# Patient Record
Sex: Female | Born: 1980 | Race: White | Hispanic: No
Health system: Midwestern US, Community
[De-identification: ages and names within clinical notes are randomized; demographics above are authoritative.]

## PROBLEM LIST (undated history)

## (undated) DIAGNOSIS — O24419 Gestational diabetes mellitus in pregnancy, unspecified control: Secondary | ICD-10-CM

## (undated) DIAGNOSIS — F988 Other specified behavioral and emotional disorders with onset usually occurring in childhood and adolescence: Secondary | ICD-10-CM

## (undated) DIAGNOSIS — Z1401 Asymptomatic hemophilia A carrier: Secondary | ICD-10-CM

## (undated) DIAGNOSIS — R519 Headache, unspecified: Secondary | ICD-10-CM

## (undated) DIAGNOSIS — F329 Major depressive disorder, single episode, unspecified: Secondary | ICD-10-CM

## (undated) DIAGNOSIS — F32A Depression, unspecified: Secondary | ICD-10-CM

## (undated) DIAGNOSIS — O24410 Gestational diabetes mellitus in pregnancy, diet controlled: Secondary | ICD-10-CM

## (undated) DIAGNOSIS — I10 Essential (primary) hypertension: Secondary | ICD-10-CM

## (undated) DIAGNOSIS — T7840XA Allergy, unspecified, initial encounter: Secondary | ICD-10-CM

## (undated) DIAGNOSIS — F419 Anxiety disorder, unspecified: Secondary | ICD-10-CM

## (undated) DIAGNOSIS — Z309 Encounter for contraceptive management, unspecified: Secondary | ICD-10-CM

## (undated) DIAGNOSIS — Z832 Family history of diseases of the blood and blood-forming organs and certain disorders involving the immune mechanism: Secondary | ICD-10-CM

## (undated) HISTORY — DX: Other specified behavioral and emotional disorders with onset usually occurring in childhood and adolescence: F98.8

## (undated) HISTORY — DX: Depression, unspecified: F32.A

## (undated) HISTORY — DX: Anxiety disorder, unspecified: F41.9

## (undated) HISTORY — PX: MOLE REMOVAL: SHX2046

## (undated) HISTORY — DX: Gestational diabetes mellitus in pregnancy, unspecified control: O24.419

## (undated) HISTORY — DX: Asymptomatic hemophilia A carrier: Z14.01

## (undated) HISTORY — DX: Essential (primary) hypertension: I10

## (undated) HISTORY — DX: Allergy, unspecified, initial encounter: T78.40XA

## (undated) HISTORY — DX: Headache, unspecified: R51.9

## (undated) HISTORY — PX: WISDOM TOOTH EXTRACTION: SHX21

---
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
## 2013-02-13 NOTE — Progress Notes (Signed)
HISTORY OF PRESENT ILLNESS  Adriana Wilkins is a 32 y.o. female.  Headache  The history is provided by the patient. This is a chronic problem. The current episode started more than 1 week ago. The problem occurs daily. Associated symptoms include headaches. Pertinent negatives include no chest pain, no abdominal pain and no shortness of breath.     This is patient's first visit to our clinic, relocated from [place], [place] awhile back. Been having   chronic daily heaaches since her 20's - frontal, temples - is dull. Has allergies - works outside all day.    Tried chiropractors, meds, accupuncture for headaches. Has had bloodwork and 8 years ago an MRI. Prevention meds - antiseizure, mood stabilizer, antidepressant - all made her feel weird.  No b-blocker. Went to neurologist - topamax - felt crazy. Takes magnesium which helps some.   Feels it does affect her ability to do her job, etc.    Now having ear rining regularly in both, feels more foggy. Started new job in January. 2 months ago it started and stopped meds too. (she stopped taking fioricet and ultram b/c didn't want to take pain meds).  With her allergies she has congestion, pressure. No eyes or sneezing. Takes claritin-D occasionally - makes her sleepy. It does clear up her nose, can still have the headache even when on it. Has tried flonase before - had nosebleeds, nasacort helped but did same thing.     Has lesion on Right side of nose - lesion for a few years, sometime itches, getting bigger. Normal moles removed before.     Wants to start birth control. Has done ortho tricyclen in her 20's  Which worked well. She had an abortion 6 mos ago. LMP 3 weeks ago normal       Review of Systems   Constitutional: Negative for fever, chills and malaise/fatigue.   HENT: Positive for congestion. Negative for ear pain and sore throat.    Eyes: Negative for blurred vision, pain and redness.   Respiratory: Negative for cough and shortness of breath.    Cardiovascular:  Negative for chest pain and palpitations.   Gastrointestinal: Negative for nausea, vomiting and abdominal pain.   Genitourinary: Negative for dysuria.   Musculoskeletal: Negative for myalgias and back pain.   Skin: Negative for rash.   Neurological: Positive for headaches. Negative for dizziness.   Psychiatric/Behavioral: Negative for depression.     BP 120/70   Pulse 75   Temp(Src) 98.5 ??F (36.9 ??C) (Oral)   Ht 5' 1.5" (1.562 m)   Wt 134 lb (60.782 kg)   BMI 24.91 kg/m2   SpO2 98%     Physical Exam   Nursing note and vitals reviewed.  Constitutional: She is oriented to person, place, and time. She appears well-developed and well-nourished. No distress.   HENT:   Head: Normocephalic and atraumatic.       Right Ear: Tympanic membrane, external ear and ear canal normal.   Left Ear: Tympanic membrane, external ear and ear canal normal.   Nose: Nose normal.   Mouth/Throat: Oropharynx is clear and moist. No posterior oropharyngeal erythema.   Eyes: Conjunctivae, EOM and lids are normal. Pupils are equal, round, and reactive to light.   Neck: No JVD present. No thyromegaly present.   Cardiovascular: Normal rate, regular rhythm, normal heart sounds and intact distal pulses.  Exam reveals no gallop and no friction rub.    No murmur heard.  Pulmonary/Chest: Effort normal and breath sounds normal.  She has no wheezes.   Lymphadenopathy:     She has no cervical adenopathy.   Neurological: She is alert and oriented to person, place, and time.   Skin: Skin is warm and dry.       ASSESSMENT and PLAN    ICD-9-CM    1. Chronic daily headache 784.0 Diclofenac Potassium (CAMBIA) 50 mg pwpk     propranolol LA (INDERAL LA) 60 mg SR capsule    has had a lot of workup and tried meds, will do trial of b-blocker, RTC 1 mos. If not improving consider neuro referral, she may want to try botox injections   2. Allergic rhinitis 477.9 mometasone (NASONEX) 50 mcg/actuation nasal spray    not well controlled, take claritin daily plus trial of  nasonex spray which may be less irritating. RTC if not improving   3. Contraception management V25.9 norgestimate-ethinyl estradiol (ORTHO TRI-CYCLEN, 28,) 0.18/0.215/0.25 mg-35 mcg (28) tablet    wants to restart OCP - did well on Ortho Tricyclen before. PAP done 2 years ago, RTC anytime for CPE/PAP   4. Skin lesion 709.9 REFERRAL TO DERMATOLOGY    looks benign on right side of nose but she would like to get full skin check anyway so will have derm evaluate it as well.   45 minutes spent counseling patient today.

## 2013-02-18 NOTE — Telephone Encounter (Signed)
[employer] will not approve the script how it is written, 1 pack equals 9 packs of powder for the Cambia.Marland KitchenMarland Kitchen However insurance will only approve  4 individual pouches of powder so this may need to be rewritten in order for the patient to get the medication

## 2013-05-08 LAB — AMB POC URINALYSIS DIP STICK MANUAL W/ MICRO
Bilirubin (UA POC): NEGATIVE
Glucose (UA POC): NEGATIVE
Ketones (UA POC): NEGATIVE
Leukocyte esterase (UA POC): NEGATIVE
Nitrites (UA POC): NEGATIVE
Specific gravity (UA POC): 1.01 (ref 1.001–1.035)
Urobilinogen (UA POC): 0.2 (ref 0.2–1)
pH (UA POC): 6 (ref 4.6–8.0)

## 2013-05-08 LAB — AMB POC COMPLETE CBC,AUTOMATED ENTER
ABS. GRANS (POC): 2.2 10*3/uL (ref 1.4–6.5)
ABS. LYMPHS (POC): 1.8 10*3/uL (ref 1.2–3.4)
ABS. MONOS (POC): 0.3 10*3/uL (ref 0.1–0.6)
GRANULOCYTES (POC): 50.8 % (ref 42.2–75.2)
HCT (POC): 44.6 % (ref 35–60)
HGB (POC): 14.6 g/dL (ref 11–18)
LYMPHOCYTES (POC): 42.7 % (ref 20.5–51.1)
MCH (POC): 30.1 pg (ref 27–31)
MCHC (POC): 32.8 g/dL — AB (ref 33–37)
MCV (POC): 91.8 fL (ref 80–99.9)
MONOCYTES (POC): 6.5 % (ref 1.7–9.3)
MPV (POC): 9.4 fL (ref 7.8–11)
PLATELET (POC): 180 10*3/uL (ref 150–450)
RBC (POC): 4.86 M/uL (ref 4–6)
RDW (POC): 13.5 % (ref 11.6–13.7)
WBC (POC): 4.3 10*3/uL — AB (ref 4.5–10.5)

## 2013-05-08 NOTE — Progress Notes (Signed)
HISTORY OF PRESENT ILLNESS  Adriana Wilkins is a 32 y.o. female.  HPI  The patient is a 32 y.o. female who is seen for a comprehensive physical exam.The patient reports no problems. Health behaviors: generally follows a low fat low cholesterol diet.    The patient is sexually active.  Current gynecologic symptoms include: none.   Otherwise the patient is doing well.       LMP:  Patient's last menstrual period was 04/19/2013.    Period Frequency: Every 28 days    Duration: 7 days    Description of Vaginal Bleeding:  moderate lochia    Current contraceptive method: oral contraceptives (estrogen/progesterone)    After her last appt she remember she had tried inderal before for her headaches and it made her tired so she didn't start it. Has been feeling better -  Exercising, OCP,  daily claritin all seem to be helping the headaches. Takes excedrin migraine when they do occur.  The OCP is doing well - spotting at first but now resolved.     Abnormal pap 5-6 years ago, had to just do a repeat one and then fine after   bloodwork done 2-3 years ago  No FH of breast cancer    Review of Systems   Constitutional: Negative for fever, chills, weight loss and malaise/fatigue.   HENT: Negative for ear pain, congestion and sore throat.    Eyes: Negative for blurred vision and pain.   Respiratory: Negative for cough, shortness of breath and wheezing.    Cardiovascular: Negative for chest pain, palpitations and leg swelling.   Gastrointestinal: Negative for heartburn, nausea, vomiting, abdominal pain, diarrhea, constipation, blood in stool and melena.   Genitourinary: Negative for dysuria, urgency and frequency.   Musculoskeletal: Negative for myalgias and joint pain.   Skin: Negative for rash.   Neurological: Negative for dizziness, sensory change, focal weakness, weakness and headaches.   Endo/Heme/Allergies: Does not bruise/bleed easily.   Psychiatric/Behavioral: Negative for depression. The patient is not nervous/anxious.    All  other systems reviewed and are negative.      BP 110/70   Pulse 94   Temp(Src) 99.2 ??F (37.3 ??C) (Oral)   Ht 5' 1.5" (1.562 m)   Wt 131 lb (59.421 kg)   BMI 24.35 kg/m2   SpO2 99%   LMP 04/19/2013   Breastfeeding? No     Physical Exam   Nursing note and vitals reviewed.  Constitutional: She is oriented to person, place, and time. She appears well-developed and well-nourished. No distress.   HENT:   Head: Normocephalic and atraumatic.   Right Ear: External ear normal.   Left Ear: External ear normal.   Nose: Nose normal.   Mouth/Throat: Oropharynx is clear and moist.   Eyes: Conjunctivae and EOM are normal. Pupils are equal, round, and reactive to light.   Neck: Normal range of motion. Neck supple. No thyromegaly present.   Cardiovascular: Normal rate, regular rhythm, normal heart sounds and intact distal pulses.  Exam reveals no gallop and no friction rub.    No murmur heard.  Pulmonary/Chest: Effort normal and breath sounds normal. She has no wheezes. Right breast exhibits no mass and no nipple discharge. Left breast exhibits no mass and no nipple discharge.   Abdominal: Soft. Bowel sounds are normal. There is no tenderness.   Genitourinary: Vagina normal and uterus normal. No breast tenderness or discharge. There is no rash or lesion on the right labia. There is no rash or lesion on  the left labia. Cervix exhibits no motion tenderness, no discharge and no friability. Right adnexum displays no mass, no tenderness and no fullness. Left adnexum displays no mass, no tenderness and no fullness. No tenderness around the vagina. No vaginal discharge found.   Musculoskeletal: Normal range of motion. She exhibits no edema and no tenderness.   Lymphadenopathy:     She has no cervical adenopathy.     She has no axillary adenopathy.        Right: No inguinal and no supraclavicular adenopathy present.        Left: No inguinal and no supraclavicular adenopathy present.   Neurological: She is alert and oriented to person, place,  and time.   Skin: Skin is warm and dry. No rash noted.   Psychiatric: She has a normal mood and affect. Her behavior is normal. Judgment and thought content normal.       ASSESSMENT and PLAN    ICD-9-CM    1. Routine gynecological examination V72.31 AMB POC COMPLETE CBC,AUTOMATED ENTER     AMB POC URINALYSIS DIP STICK MANUAL W/ MICRO     METABOLIC PANEL, COMPREHENSIVE     LIPID PANEL     PAP (IMAGE GUIDED) + HPV HIGH RISK    PAP and breast exam done today, if PAP normal repeat in 2-3 years. routine labs today.

## 2013-05-09 LAB — METABOLIC PANEL, COMPREHENSIVE
A-G Ratio: 1.9 (ref 1.1–2.5)
ALT (SGPT): 10 IU/L (ref 0–32)
AST (SGOT): 13 IU/L (ref 0–40)
Albumin: 4.3 g/dL (ref 3.5–5.5)
Alk. phosphatase: 38 IU/L — ABNORMAL LOW (ref 39–117)
BUN/Creatinine ratio: 10 (ref 8–20)
BUN: 9 mg/dL (ref 6–20)
Bilirubin, total: 0.4 mg/dL (ref 0.0–1.2)
CO2: 21 mmol/L (ref 18–29)
Calcium: 9.4 mg/dL (ref 8.7–10.2)
Chloride: 102 mmol/L (ref 97–108)
Creatinine: 0.86 mg/dL (ref 0.57–1.00)
GFR est AA: 103 mL/min/{1.73_m2} (ref >59)
GFR est non-AA: 90 mL/min/{1.73_m2} (ref >59)
GLOBULIN, TOTAL: 2.3 g/dL (ref 1.5–4.5)
Glucose: 82 mg/dL (ref 65–99)
Potassium: 4 mmol/L (ref 3.5–5.2)
Protein, total: 6.6 g/dL (ref 6.0–8.5)
Sodium: 139 mmol/L (ref 134–144)

## 2013-05-09 LAB — LIPID PANEL
Cholesterol, total: 205 mg/dL — ABNORMAL HIGH (ref 100–199)
HDL Cholesterol: 84 mg/dL (ref >39)
LDL, calculated: 104 mg/dL — ABNORMAL HIGH (ref 0–99)
Triglyceride: 85 mg/dL (ref 0–149)
VLDL, calculated: 17 mg/dL (ref 5–40)

## 2013-05-09 NOTE — Telephone Encounter (Signed)
Message copied by Deatra Ina on Thu May 09, 2013 12:15 PM  ------       Message from: Charlsie Merles CASTON       Created: Thu May 09, 2013 10:24 AM         Her labs look good - all normal. Her lipids are fine for her age range - chol 205, LDL/bad 104. Good chol/HDL high at 84 which is a good thing - this can make the total chol # a little higher but is OK b/c it protects against plaque formation in the blood vessels.  ------

## 2013-05-09 NOTE — Telephone Encounter (Signed)
Left message for patient to return our call.

## 2013-05-09 NOTE — Telephone Encounter (Signed)
Patient notified of information.

## 2013-05-10 LAB — PAP (IMAGE GUIDED) + HPV HIGH RISK
.: 0
HPV DNA Probe, High Risk: NEGATIVE
HPV, High Risk: NEGATIVE
LABCORP 019018: 0

## 2013-07-10 ENCOUNTER — Encounter

## 2013-07-10 NOTE — Telephone Encounter (Signed)
Referral ordered. Stay on birth control pills until she sees them and they give her further instructions

## 2013-07-10 NOTE — Telephone Encounter (Signed)
Patient would 1) like a referral to a gyn to have an IUD placed. 2) does she need to stay on the pills until the appointment?

## 2013-07-10 NOTE — Telephone Encounter (Signed)
Patient notified of information.

## 2013-07-17 NOTE — Progress Notes (Signed)
HISTORY OF PRESENT ILLNESS  Adriana Wilkins is a 32 y.o. female.  HPI  Headache  The history is provided by the patient. This is a chronic problem. The current episode started more than 1 week ago. The problem occurs daily. Associated symptoms include headaches. Pertinent negatives include no chest pain, no abdominal pain and no shortness of breath.     This is patient's first visit to our clinic, relocated from [place], [place] awhile back. Been having   chronic daily heaaches since her 20's - frontal, temples - is dull. Has allergies - works outside all day.    Tried chiropractors, meds, accupuncture for headaches. Has had bloodwork and 8 years ago an MRI. Prevention meds - antiseizure, mood stabilizer, antidepressant - all made her feel weird.  No b-blocker. Went to neurologist - topamax - felt crazy. Takes magnesium which helps some.   Feels it does affect her ability to do her job, etc.    Now having ear rining regularly in both, feels more foggy. Started new job in January. 2 months ago it started and stopped meds too. (she stopped taking fioricet and ultram b/c didn't want to take pain meds).  With her allergies she has congestion, pressure. No eyes or sneezing. Takes claritin-D occasionally - makes her sleepy. It does clear up her nose, can still have the headache even when on it. Has tried flonase before - had nosebleeds, nasacort helped but did same thing.     Has lesion on Right side of nose - lesion for a few years, sometime itches, getting bigger. Normal moles removed before.     Wants to start birth control. Has done ortho tricyclen in her 20's  Which worked well. She had an abortion 6 mos ago. LMP 3 weeks ago normal     UPDATE 07/17/13: having Side effect from OCP - moody, weight gain  Wants to try nuva ring instead to see if less side effects.    Headaches are back - taking advil and excedrin.   She wants to see if they improve once off of OCP.        Review of Systems   Constitutional: Negative for fever,  chills and malaise/fatigue.   HENT: Negative for ear pain, congestion and sore throat.    Eyes: Negative for blurred vision, pain and redness.   Respiratory: Negative for cough and shortness of breath.    Cardiovascular: Negative for chest pain and palpitations.   Gastrointestinal: Negative for nausea, vomiting and abdominal pain.   Genitourinary: Negative for dysuria.   Musculoskeletal: Negative for myalgias and back pain.   Skin: Negative for rash.   Neurological: Positive for headaches. Negative for dizziness.   Psychiatric/Behavioral: Negative for depression.     BP 130/78   Pulse 88   Temp(Src) 98.4 ??F (36.9 ??C) (Oral)   Ht 5' 1.5" (1.562 m)   Wt 137 lb (62.143 kg)   BMI 25.47 kg/m2   SpO2 98%     Physical Exam   Constitutional: She is oriented to person, place, and time. She appears well-developed and well-nourished. She is cooperative. No distress.   HENT:   Head: Normocephalic and atraumatic.   Pulmonary/Chest: Effort normal. No respiratory distress.   Neurological: She is alert and oriented to person, place, and time. Coordination and gait normal.   Skin: No rash noted. She is not diaphoretic. No pallor.   Psychiatric: She has a normal mood and affect. Her speech is normal and behavior is normal. Judgment and thought  content normal. Her mood appears not anxious. Cognition and memory are normal. She does not exhibit a depressed mood.       ASSESSMENT and PLAN    ICD-9-CM   1. Contraception management V25.9    wants to switch from OCP to Nuvaring - new rx sent. call back if any side effects or problems.   2. Chronic daily headache 784.0    if doesn't improve after changing birth control, told her to call [place] for neurology referral.

## 2013-12-20 LAB — AMB POC COMPLETE CBC,AUTOMATED ENTER
ABS. GRANS (POC): 2.7 10*3/uL (ref 1.4–6.5)
ABS. LYMPHS (POC): 2.1 10*3/uL (ref 1.2–3.4)
ABS. MONOS (POC): 0.4 10*3/uL (ref 0.1–0.6)
GRANULOCYTES (POC): 51.9 % (ref 42.2–75.2)
HCT (POC): 44.3 % (ref 35–60)
HGB (POC): 13.9 g/dL (ref 11–18)
LYMPHOCYTES (POC): 39.7 % (ref 20.5–51.1)
MCH (POC): 28.1 pg (ref 27–31)
MCHC (POC): 31.4 g/dL — AB (ref 33–37)
MCV (POC): 89.4 fL (ref 80–99.9)
MONOCYTES (POC): 8.4 % (ref 1.7–9.3)
MPV (POC): 7.7 fL — AB (ref 7.8–11)
PLATELET (POC): 208 10*3/uL (ref 150–450)
RBC (POC): 4.95 M/uL (ref 4–6)
RDW (POC): 12.7 % (ref 11.6–13.7)
WBC (POC): 5.2 10*3/uL (ref 4.5–10.5)

## 2013-12-20 NOTE — Progress Notes (Signed)
HISTORY OF PRESENT ILLNESS  Adriana Wilkins is a 33 y.o. female. Has suffered from chronic daily headaches since her 4420's. Is haivn them more frequently  Has weight gain has fatigue is always tired no matter how much slep she gts has brain fog. 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 Has pain in her hands and feet. Has fh of Dm no thyroid disease. Her hands get swollen and they hurt an throbbing ache mostly in her fingers. No redness. Has been having heavy periods Does not eat much meat.   Recently went back to [place] was started on med for an ADD by an MD in [place] No sure if what she needs or not.   HPI    Review of Systems   Constitutional: Weight loss: weight gain.   Musculoskeletal: Positive for joint pain (in fingers of both hands).   Psychiatric/Behavioral:        Hypersomnia       Physical Exam   Constitutional: She is oriented to person, place, and time. She appears well-developed and well-nourished.   HENT:   Head: Normocephalic.   Eyes: Pupils are equal, round, and reactive to light.   Neck: Normal range of motion. Neck supple. No thyromegaly present.   Cardiovascular: Normal rate, regular rhythm and normal heart sounds.    Pulmonary/Chest: Effort normal and breath sounds normal.   Abdominal: Soft. Bowel sounds are normal.   Musculoskeletal: Normal range of motion.   Lymphadenopathy:     She has no cervical adenopathy.   Neurological: She is alert and oriented to person, place, and time.   Skin: Skin is warm and dry.   Psychiatric: She has a normal mood and affect.   Nursing note and vitals reviewed.    BP 132/80    Pulse 110    Temp(Src) 98.6 ??F (37 ??C) (Oral)    Ht 5' 1.5" (1.562 m)    Wt 143 lb (64.864 kg)    BMI 26.59 kg/m2      LMP 11/25/2013     ASSESSMENT and PLAN    ICD-9-CM    1. Fatigue 780.79 TSH, 3RD GENERATION     AMB POC COMPLETE CBC,AUTOMATED ENTER   Checking labs. May be untreated depression as she has a history of this. May also consider a sleep study if symptoms  persists and labs are normal. Will have her follow up with Dr Alfredo Bachecil in 2 weeks as she needs ADD meds

## 2013-12-21 LAB — TSH 3RD GENERATION: TSH: 1.84 u[IU]/mL (ref 0.450–4.500)

## 2013-12-25 MED ORDER — BUPROPION XL 150 MG 24 HR TAB
150 mg | ORAL_TABLET | ORAL | Status: DC
Start: 2013-12-25 — End: 2014-01-20

## 2013-12-25 NOTE — Progress Notes (Signed)
HISTORY OF PRESENT ILLNESS  Adriana Wilkins is a 33 y.o. female.  HPI   The patient is a 33 y.o. female who is seen for complaints of depression. she  complains of depressed mood, anhedonia, weight gain and hypersomnia. Onset was approximately 1 year ago, gradually worsening since that time.  He denies current suicidal and homicidal plan or intent.   Family history significant for depression.Possible organic causes contributing are: none.  Risk factors: none apparent Previous treatment includes none.   Feels tired all the time, had thyroid checked but was normal. Not anemic either. Snores sometimes but not much, doesn't think it is sleep apnea.   After her last appt been thinking about it more and she thinks it could be depression - in the last year overall felt a decline, worse in past few months (even before winter). Was blah/bummed out at her best friends wedding in September. Mom has depression and her MGM. Has had little bouts of it but hasn't had to take meds for it before. Is messing up at work.   Today is her day off, slept till 11am instead of getting up and doing things she had planned. Can sleep more than usual, but still feels tired. Skipped a conference recently. Crying more. Appetite normal, but is gaining weight. No panic attacks or anxiety.   Saw her old doctor in [place] - he gave her adderall and xanax b/c she wasn't getting things done. It helps temporarily but she feels it is masking the real issue. 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  Xanax - was taking it 1/2 in afternoon as coming off of adderall b/c it makes her irritable.    Review of Systems   Constitutional: Positive for malaise/fatigue. Negative for fever and chills.   HENT: Negative for congestion, ear pain and sore throat.    Eyes: Negative for blurred vision, pain and redness.   Respiratory: Negative for cough and shortness of breath.    Cardiovascular: Negative for chest pain and palpitations.   Gastrointestinal:  Negative for nausea, vomiting and abdominal pain.   Genitourinary: Negative for dysuria.   Musculoskeletal: Negative for myalgias and back pain.   Skin: Negative for rash.   Neurological: Negative for dizziness and headaches.   Psychiatric/Behavioral: Positive for depression.     BP 136/78    Pulse 107    Temp(Src) 98.5 ??F (36.9 ??C) (Oral)    Ht 5' 1.5" (1.562 m)    Wt 143 lb (64.864 kg)    BMI 26.59 kg/m2      SpO2 99%    LMP 11/25/2013        Physical Exam   Constitutional: She is oriented to person, place, and time. She appears well-developed and well-nourished. She is cooperative. No distress.   HENT:   Head: Normocephalic and atraumatic.   Pulmonary/Chest: Effort normal. No respiratory distress.   Neurological: She is alert and oriented to person, place, and time. Coordination and gait normal.   Skin: No rash noted. She is not diaphoretic. No pallor.   Psychiatric: Her speech is normal and behavior is normal. Judgment and thought content normal. Her mood appears not anxious. Cognition and memory are normal. She exhibits a depressed mood.   Tearful at times       ASSESSMENT and PLAN    ICD-9-CM    1. Depression 311 buPROPion XL (WELLBUTRIN XL) 150 mg tablet    having episode of depression, start wellbutrin XL 150mg   and RTC 2-3 wks for close follow-up, call sooner if problems. went over possible SE's.   25 minutes spent counseling patient today.   rec she hold off on taking the xanax or adderall right now as we try to work on her depression which seems to be the main underlying issue.

## 2014-01-08 NOTE — Progress Notes (Signed)
HISTORY OF PRESENT ILLNESS  Adriana Wilkins is a 33 y.o. female.  HPI    The patient is a 33 y.o. female who is seen for complaints of depression. she  complains of depressed mood, anhedonia, weight gain and hypersomnia. Onset was approximately 1 year ago, gradually worsening since that time.  He denies current suicidal and homicidal plan or intent.   Family history significant for depression.Possible organic causes contributing are: none.  Risk factors: none apparent Previous treatment includes none.   Feels tired all the time, had thyroid checked but was normal. Not anemic either. Snores sometimes but not much, doesn't think it is sleep apnea.   After her last appt been thinking about it more and she thinks it could be depression - in the last year overall felt a decline, worse in past few months (even before winter). Was blah/bummed out at her best friends wedding in September. Mom has depression and her MGM. Has had little bouts of it but hasn't had to take meds for it before. Is messing up at work.   Today is her day off, slept till 11am instead of getting up and doing things she had planned. Can sleep more than usual, but still feels tired. Skipped a conference recently. Crying more. Appetite normal, but is gaining weight. No panic attacks or anxiety.   Saw her old doctor in [place] - he gave her adderall and xanax b/c she wasn't getting things done. It helps temporarily but she feels it is masking the real issue. 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  Xanax - was taking it 1/2 in afternoon as coming off of adderall b/c it makes her irritable.    UPDATE 01/08/14: slight improvement since starting wellbutrin, still having hard time getting stuff done. Feels she is about 15% better. This week restarted the adderall b/c was more brain fogginess but says she would prefer to not have to take it.  Still has some wellbutrin XL 150mg  so she can try increasing dose to see if it works better or not.     Review of Systems   Constitutional: Positive for malaise/fatigue. Negative for fever and chills.   HENT: Negative for congestion, ear pain and sore throat.    Eyes: Negative for blurred vision, pain and redness.   Respiratory: Negative for cough and shortness of breath.    Cardiovascular: Negative for chest pain and palpitations.   Gastrointestinal: Negative for nausea, vomiting and abdominal pain.   Genitourinary: Negative for dysuria.   Musculoskeletal: Negative for myalgias and back pain.   Skin: Negative for rash.   Neurological: Negative for dizziness and headaches.   Psychiatric/Behavioral: Positive for depression.     BP 126/78    Pulse 86    Temp(Src) 98.1 ??F (36.7 ??C) (Oral)    Ht 5' 1.5" (1.562 m)    Wt 142 lb (64.411 kg)    BMI 26.40 kg/m2      SpO2 99%    LMP 11/25/2013        Physical Exam   Constitutional: She is oriented to person, place, and time. She appears well-developed and well-nourished. She is cooperative. No distress.   HENT:   Head: Normocephalic and atraumatic.   Pulmonary/Chest: Effort normal. No respiratory distress.   Neurological: She is alert and oriented to person, place, and time. Coordination and gait normal.   Skin: No rash noted. She is not diaphoretic. No pallor.   Psychiatric: Her speech is normal  and behavior is normal. Judgment and thought content normal. Her mood appears not anxious. Cognition and memory are normal. She exhibits a depressed mood.       ROS    Physical Exam    ASSESSMENT and PLAN    ICD-9-CM   1. Depression 311    slight improvement, incr wellbutrin XL to 300mg  qam. call in 1-2wks to let us know if that dose is working and will call in. RTC 1 mos to recheck.   15 minutes spent counseling patient today.

## 2014-01-20 MED ORDER — BUPROPION XL 300 MG 24 HR TAB
300 mg | ORAL_TABLET | ORAL | Status: DC
Start: 2014-01-20 — End: 2014-07-24

## 2014-01-20 NOTE — Telephone Encounter (Signed)
Doing well on Wellbutrin xl 300mg . Refill request to the [facility] in [place].

## 2014-04-16 NOTE — Telephone Encounter (Signed)
Request for refill on xanax 0.5mg  takes prn to [facility] in [place].

## 2014-04-17 MED ORDER — ALPRAZOLAM 0.5 MG TAB
0.5 mg | ORAL_TABLET | Freq: Two times a day (BID) | ORAL | Status: DC | PRN
Start: 2014-04-17 — End: 2014-07-02

## 2014-04-17 NOTE — Telephone Encounter (Signed)
Patient notified of information. rx called in.

## 2014-04-17 NOTE — Telephone Encounter (Signed)
Small supply to call in

## 2014-06-09 NOTE — Telephone Encounter (Signed)
Patient has called in today asking for a referral to [facility] for testing,  She has not been seen in office since March,  How would you like to proceed?  Chyrl Civatte

## 2014-06-10 ENCOUNTER — Encounter

## 2014-06-10 NOTE — Telephone Encounter (Signed)
Referral placed

## 2014-06-10 NOTE — Telephone Encounter (Signed)
We would need to know what the referral is for and then can decide if appt needed? No notes right now to support referral

## 2014-07-02 NOTE — Progress Notes (Signed)
07/02/2014    Adriana Wilkins  May 03, 1981  Age: 33 y.o.    Patient's last menstrual period was 06/24/2014 (exact date).    G2 P0020  PCP: Cherie Ouch Alfredo Bach, MD  Patient does see them for regular preventative visits.      HPI: AE new pt. Wants to conceive soon. 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 Has had 2 el ab's. Has been having yrly exams/paps with pcp in the past. Knows she needs to come off adderall with pregnancy. On wellbutrin for depression. It also helps her with HAs, so they are probably stress related. 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 Her mom is a carrier. Has an appt soon with [facility]. Husband with no FH genetic d/o's or birth defects. Has had recent nl tsh, lipids. No concerns about hiv risks. Gets occ vag opening irritation and swelling. None today. Goes away in a few days spontaneously.     Menstrual History  Menarche: 11  Period cycle: 28 - 30  Period duration in days: 4  Period pattern: Regular  Menstrual flow: Moderate  Period control: Tampon regular  Frequency of change for flow control: 3 times a day  Dysmenorrhea: (!) Mild  Dysmenorrhea symptoms: Cramping  Dyspareunia: None  Post-coital bleeding: None   GYN HISTORY 07/02/2014   Current form of contraception None   Menarche 11   Period cycle 28 - 30   Period duration in days 4   Period pattern Regular   Menstrual flow Moderate   Period control Tampon regular   Frequency of change for flow control 3 x d   Dysmenorrhea Mild   Dysmenorrhea symptoms Cramping   Dyspareunia None   Post-coital bleeding None   Pap Date 05/08/2013   Pap Results wnl       Allergies, medications, past medical and surgical history, social history, family history all reviewed.       Review of Systems  +HAs, heavy menses, cramps, change in moles(on L hand, sees derm regularly, plans to have them ck it)  Constitutional:  Denies unexplained weight loss or heat or cold intolerance  ENT: Denies change in vision, change in hearing  Cardiovascular:  Denies chest pain, swelling in legs or feet, shortness of  breath when lying flat  Respiratory:  Denies shortness of breath, cough greater than 2 weeks or coughing up blood  Gastro: Denies diarrhea greater than 2 weeks, rectal bleeding, bloody stools, heartburn, or constipation  GU:  Denies blood in urine, nocturia, dysuria or incontinence  Breast:  Denies nipple discharge, masses or pain  Skin:  Denies rash greater than 2 weeks, change in moles  Musculoskeletal/Neuro:  Denies joint pain, muscle weakness, seizures, loss of balance or frequent falls  Psych:  Denies frequent crying spells or severe anxiety  Heme:  Denies easy bruising, bleeding gums, frequent nosebleeds or swollen lymph nodes  GYN:  Denies bleeding or spotting between menses,  menses longer than 7 days, pain with sex    BP 130/72 mmHg   Ht 5' 1.5" (1.562 m)   Wt 129 lb (58.514 kg)   BMI 23.98 kg/m2   LMP 06/24/2014 (Exact Date)    Body mass index is 23.98 kg/(m^2).    Pt in no distress. Alert and oriented x3. Affect bright.  Well developed, well nourished.  HEENT: normocephalic, atraumatic. Sclerae nonicteric.  Neck supple w/o thyromegaly. Trachea midline.  Heart: regular rate and rhythm, no murmur or gallop  Lungs: clear to auscultation. Respiratory effort normal.  Breasts: no masses or axillary lymphadenopathy  Abdomen:  soft and nontender, no masses, no organomegaly, no ventral hernia  Pelvic: normal external genitalia, urethral meatus, urethra, vagina and cervix. Bimanual exam w/o masses or tenderness. Bladder nontender. Uterus normal size. Adnexae normal.  Perineum and anus normal. No hemorrhoids.       Encounter Diagnosis   Name Primary?   ??? Routine gynecological examination Yes         Orders Placed This Encounter   ??? PAP, LB, RFX HPV RUEAV([id number])          Pt counseled about CF screen. Can be cked for rub immunity but pt is only using natural family planning and she'd have to use good BC to get the vaccine. Rx pnv.     Luther Hearing, MD

## 2014-07-04 LAB — PAP, LB, RFX HPV ASCUS(507301)
.: 0
LABCORP 019018: 0

## 2014-07-24 ENCOUNTER — Ambulatory Visit
Admit: 2014-07-24 | Discharge: 2014-07-24 | Payer: BLUE CROSS/BLUE SHIELD | Attending: Family Medicine | Primary: Family Medicine

## 2014-07-24 DIAGNOSIS — F32A Depression, unspecified: Secondary | ICD-10-CM

## 2014-07-24 MED ORDER — LISDEXAMFETAMINE 30 MG CAP
30 mg | ORAL_CAPSULE | ORAL | Status: DC
Start: 2014-07-24 — End: 2014-08-25

## 2014-07-24 MED ORDER — BUPROPION XL 300 MG 24 HR TAB
300 mg | ORAL_TABLET | ORAL | Status: DC
Start: 2014-07-24 — End: 2015-01-14

## 2014-07-24 NOTE — Progress Notes (Signed)
HISTORY OF PRESENT ILLNESS  Adriana Wilkins is a 33 y.o. female.  HPI   The patient is a 33 y.o. female who is seen for follow up of depression, ADD.  Symptoms are stable.  The patient is following treatment regimen with good compliance. wellbutrin doing well, might be thinking ahead to wanting to become pregnant so asked about meds and what she needs to do.     ADD - adderall does make her irritable so wants to try something different, heard good things about vyvanse.   Review of Systems   Constitutional: Negative for fever, chills and malaise/fatigue.   HENT: Negative for congestion, ear pain and sore throat.    Eyes: Negative for blurred vision, pain and redness.   Respiratory: Negative for cough and shortness of breath.    Cardiovascular: Negative for chest pain and palpitations.   Gastrointestinal: Negative for nausea, vomiting and abdominal pain.   Genitourinary: Negative for dysuria.   Musculoskeletal: Negative for myalgias and back pain.   Skin: Negative for rash.   Neurological: Negative for dizziness and headaches.   Psychiatric/Behavioral: Negative for depression.     BP 122/82 mmHg   Pulse 105   Temp(Src) 98.3 ??F (36.8 ??C) (Oral)   Ht 5' 1.5" (1.562 m)   Wt 130 lb (58.968 kg)   BMI 24.17 kg/m2   LMP 06/24/2014 (Exact Date)     Physical Exam   Constitutional: She is oriented to person, place, and time. She appears well-developed and well-nourished. She is cooperative. No distress.   HENT:   Head: Normocephalic and atraumatic.   Pulmonary/Chest: Effort normal. No respiratory distress.   Neurological: She is alert and oriented to person, place, and time. Coordination and gait normal.   Skin: No rash noted. She is not diaphoretic. No pallor.   Psychiatric: She has a normal mood and affect. Her speech is normal and behavior is normal. Judgment and thought content normal. Her mood appears not anxious. Cognition and memory are normal. She does not exhibit a depressed mood.       ASSESSMENT and PLAN     ICD-10-CM ICD-9-CM    1. Depression F32.9 311 buPROPion XL (WELLBUTRIN XL) 300 mg XL tablet    cont wellbutrin XL 300mg , RTC 6 mos. discsused that she would need to stop or switch to different antidepressant once starts trying to conceive   2. ADD (attention deficit disorder) F90.9 314.00 lisdexamfetamine (VYVANSE) 30 mg capsule    wants to switch to vyvanse, start at 30mg  and titrate up if needed. call back to let us know which dose effective and will print 2 more rx. RTC 3 mos   pt also aware she should stop any ADD meds once tries to become pregnant. Went over possible SE from vyvanse - mostly insomnia since is so long acting.  15 minutes spent counseling patient today.

## 2014-07-28 NOTE — Telephone Encounter (Signed)
Patient states the pharmacy told her she needs prior authorization on her Vyvanse.

## 2014-07-29 NOTE — Telephone Encounter (Signed)
Prior auth sent 07/28/14

## 2014-08-12 NOTE — Telephone Encounter (Signed)
She will have to be seen for this problem b/c we haven't discussed her headaches since 2014, so we must first evaluate how things have been going since then before decided what med to prescribe. Even though she was just here on 10/8 we didn't discuss headaches at all.

## 2014-08-12 NOTE — Telephone Encounter (Signed)
This medication was last prescribed 01/2013 for headaches, Patient stated it also helped with anxiety  Offered her an appointment she refused was just here on 10/8

## 2014-08-14 NOTE — Telephone Encounter (Signed)
Spoke with patient she understood that no medication will be given, Appointment was offered she declined and stated will call back once discussing her schedule

## 2014-08-25 ENCOUNTER — Encounter

## 2014-08-25 MED ORDER — LISDEXAMFETAMINE 60 MG CAPSULE
60 mg | ORAL_CAPSULE | ORAL | Status: DC
Start: 2014-08-25 — End: 2014-10-21

## 2014-08-25 MED ORDER — LISDEXAMFETAMINE 60 MG CAPSULE
60 mg | ORAL_CAPSULE | Freq: Every day | ORAL | Status: DC
Start: 2014-08-25 — End: 2014-09-17

## 2014-08-25 NOTE — Telephone Encounter (Signed)
Patient wanting to go with 60 mg in Vyvanse instead of 30 titrated up during 30 day trial needing a refill in the 60 mg

## 2014-08-25 NOTE — Telephone Encounter (Signed)
Pt called

## 2014-08-25 NOTE — Telephone Encounter (Signed)
Printed 2 rx's for the vyvanse 60mg  for her to pick up

## 2014-09-17 NOTE — Telephone Encounter (Signed)
Requesting refill   Per notes if was working fine patient would only need to call for refill

## 2014-09-18 MED ORDER — LISDEXAMFETAMINE 60 MG CAPSULE
60 mg | ORAL_CAPSULE | Freq: Every day | ORAL | Status: DC
Start: 2014-09-18 — End: 2014-10-21

## 2014-09-18 NOTE — Telephone Encounter (Signed)
Printed another month, looks like will be due for her 3 mos f/u in Jan

## 2014-10-21 ENCOUNTER — Ambulatory Visit
Admit: 2014-10-21 | Discharge: 2014-10-21 | Payer: BLUE CROSS/BLUE SHIELD | Attending: Family Medicine | Primary: Family Medicine

## 2014-10-21 DIAGNOSIS — F988 Other specified behavioral and emotional disorders with onset usually occurring in childhood and adolescence: Secondary | ICD-10-CM

## 2014-10-21 MED ORDER — DEXMETHYLPHENIDATE 10 MG TABLET
10 mg | ORAL_TABLET | ORAL | Status: DC
Start: 2014-10-21 — End: 2015-01-14

## 2014-10-21 MED ORDER — LISDEXAMFETAMINE 60 MG CAPSULE
60 mg | ORAL_CAPSULE | ORAL | Status: DC
Start: 2014-10-21 — End: 2015-01-14

## 2014-10-21 MED ORDER — LISDEXAMFETAMINE 60 MG CAPSULE
60 mg | ORAL_CAPSULE | Freq: Every day | ORAL | Status: DC
Start: 2014-10-21 — End: 2015-01-14

## 2014-10-21 NOTE — Progress Notes (Signed)
HISTORY OF PRESENT ILLNESS  Adriana Wilkins is a 34 y.o. female.  HPI    The patient is a 34 y.o. female who is seen for follow up of depression, ADD.  Symptoms are stable.  The patient is following treatment regimen with good compliance. wellbutrin doing well, might be thinking ahead to wanting to become pregnant so asked about meds and what she needs to do.     ADD - adderall does make her irritable so wants to try something different, heard good things about vyvanse.     UPDATE 10/21/14: vyvanse working well - less headaches, less irritable than on the adderall. Lasts most of the day but occasionally wears off by end of day. Wants to know what she can do about her longer days when not lasting?  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      Review of Systems   Constitutional: Negative for fever, chills and malaise/fatigue.   HENT: Negative for congestion, ear pain and sore throat.    Eyes: Negative for blurred vision, pain and redness.   Respiratory: Negative for cough and shortness of breath.    Cardiovascular: Negative for chest pain and palpitations.   Gastrointestinal: Negative for nausea, vomiting and abdominal pain.   Genitourinary: Negative for dysuria.   Musculoskeletal: Negative for myalgias and back pain.   Skin: Negative for rash.   Neurological: Negative for dizziness and headaches.   Psychiatric/Behavioral: Negative for depression.     BP 103/72 mmHg   Pulse 99   Temp(Src) 98.4 ??F (36.9 ??C) (Oral)   Ht 5' 1.5" (1.562 m)   Wt 135 lb (61.236 kg)   BMI 25.10 kg/m2   SpO2 99%     Physical Exam   Constitutional: She is oriented to person, place, and time. She appears well-developed and well-nourished. She is cooperative. No distress.   HENT:   Head: Normocephalic and atraumatic.   Pulmonary/Chest: Effort normal. No respiratory distress.    Neurological: She is alert and oriented to person, place, and time. Coordination and gait normal.   Skin: No rash noted. She is not diaphoretic. No pallor.   Psychiatric: She has a normal mood and affect. Her speech is normal and behavior is normal. Judgment and thought content normal. Her mood appears not anxious. Cognition and memory are normal. She does not exhibit a depressed mood.       ROS    Physical Exam    ASSESSMENT and PLAN    ICD-10-CM ICD-9-CM    1. ADD (attention deficit disorder) F90.9 314.00 Lisdexamfetamine (VYVANSE) 60 mg capsule      Lisdexamfetamine (VYVANSE) 60 mg capsule      Lisdexamfetamine (VYVANSE) 60 mg capsule      dexmethylphenidate (FOCALIN) 10 mg tablet    doing much better on vyvnase 60mg . doesn't last whole day so rec try focalin prn in afternoons. Call if any problems, otherwise RTC 3 mos   15 minutes spent counseling patient today.

## 2014-11-10 MED ORDER — ALPRAZOLAM 0.5 MG TAB
0.5 mg | ORAL_TABLET | Freq: Two times a day (BID) | ORAL | Status: DC | PRN
Start: 2014-11-10 — End: 2014-12-22

## 2014-11-10 NOTE — Telephone Encounter (Signed)
rx called in.

## 2014-12-22 MED ORDER — ALPRAZOLAM 0.5 MG TAB
0.5 mg | ORAL_TABLET | Freq: Two times a day (BID) | ORAL | Status: DC | PRN
Start: 2014-12-22 — End: 2015-04-13

## 2014-12-22 NOTE — Telephone Encounter (Signed)
rx called in. Patient notified of information.

## 2015-01-14 ENCOUNTER — Ambulatory Visit
Admit: 2015-01-14 | Discharge: 2015-01-14 | Payer: BLUE CROSS/BLUE SHIELD | Attending: Family Medicine | Primary: Family Medicine

## 2015-01-14 DIAGNOSIS — F988 Other specified behavioral and emotional disorders with onset usually occurring in childhood and adolescence: Secondary | ICD-10-CM

## 2015-01-14 MED ORDER — BUPROPION XL 300 MG 24 HR TAB
300 mg | ORAL_TABLET | ORAL | Status: DC
Start: 2015-01-14 — End: 2015-04-13

## 2015-01-14 MED ORDER — LISDEXAMFETAMINE 60 MG CAPSULE
60 mg | ORAL_CAPSULE | Freq: Every day | ORAL | Status: DC
Start: 2015-01-14 — End: 2015-04-13

## 2015-01-14 MED ORDER — LISDEXAMFETAMINE 60 MG CAPSULE
60 mg | ORAL_CAPSULE | ORAL | Status: DC
Start: 2015-01-14 — End: 2015-04-13

## 2015-01-14 NOTE — Progress Notes (Signed)
HISTORY OF PRESENT ILLNESS  Adriana Wilkins is a 34 y.o. female.  HPI     The patient is a 34 y.o. female who is seen for follow up of depression, ADD.  Symptoms are stable.  The patient is following treatment regimen with good compliance. wellbutrin doing well, might be thinking ahead to wanting to become pregnant so asked about meds and what she needs to do.     ADD - adderall does make her irritable so wants to try something different, heard good things about vyvanse.     UPDATE 10/21/14: vyvanse working well - less headaches, less irritable than on the adderall. Lasts most of the day but occasionally wears off by end of day. Wants to know what she can do about her longer days when not lasting?  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    UPDATE 01/14/15: vyvanse working well. Takes 1/2 adderall prn in afternoons, better than focalin (she didn't like that one). Xanax prn tension HA/stress - every other day (only takes 1/2 tab) - that takes care of it. Has tried OTC meds but doesn't work as well.  wellbutrin working well.      Review of Systems   Constitutional: Negative for fever, chills and malaise/fatigue.   HENT: Negative for congestion, ear pain and sore throat.    Eyes: Negative for blurred vision, pain and redness.   Respiratory: Negative for cough and shortness of breath.    Cardiovascular: Negative for chest pain and palpitations.   Gastrointestinal: Negative for nausea, vomiting and abdominal pain.   Genitourinary: Negative for dysuria.   Musculoskeletal: Negative for myalgias and back pain.   Skin: Negative for rash.   Neurological: Negative for dizziness and headaches.   Psychiatric/Behavioral: Negative for depression.     Blood pressure 130/81, pulse 109, temperature 98.3 ??F (36.8 ??C), temperature source Oral, height 5' 1.5" (1.562 m), weight 131 lb (59.421 kg).      Physical Exam   Constitutional: She is oriented to person, place, and time. She appears well-developed and well-nourished. She is cooperative. No distress.   HENT:   Head: Normocephalic and atraumatic.   Pulmonary/Chest: Effort normal. No respiratory distress.   Neurological: She is alert and oriented to person, place, and time. Coordination and gait normal.   Skin: No rash noted. She is not diaphoretic. No pallor.   Psychiatric: She has a normal mood and affect. Her speech is normal and behavior is normal. Judgment and thought content normal. Her mood appears not anxious. Cognition and memory are normal. She does not exhibit a depressed mood.          ROS    Physical Exam    ASSESSMENT and PLAN    ICD-10-CM ICD-9-CM    1. ADD (attention deficit disorder) F90.0 314.00 Lisdexamfetamine (VYVANSE) 60 mg capsule      Lisdexamfetamine (VYVANSE) 60 mg capsule      Lisdexamfetamine (VYVANSE) 60 mg capsule    doing much better on vyvnase 60mg . didn't like prn focalin, still has some adderall that she uses prn in afternoons. RTC 3 mos   2. 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   3. Chronic daily headache R51 784.0  takes small amt xanax sparingly, refill called in, let us know when refill needed.   15 minutes spent counseling patient today.

## 2015-04-13 ENCOUNTER — Ambulatory Visit
Admit: 2015-04-13 | Discharge: 2015-04-13 | Payer: BLUE CROSS/BLUE SHIELD | Attending: Family Medicine | Primary: Family Medicine

## 2015-04-13 DIAGNOSIS — F988 Other specified behavioral and emotional disorders with onset usually occurring in childhood and adolescence: Secondary | ICD-10-CM

## 2015-04-13 MED ORDER — LISDEXAMFETAMINE 60 MG CAPSULE
60 mg | ORAL_CAPSULE | ORAL | Status: AC
Start: 2015-04-13 — End: ?

## 2015-04-13 MED ORDER — AMPHETAMINE-DEXTROAMPHETAMINE 20 MG TAB
20 mg | ORAL_TABLET | ORAL | Status: AC
Start: 2015-04-13 — End: ?

## 2015-04-13 MED ORDER — LISDEXAMFETAMINE 60 MG CAPSULE
60 mg | ORAL_CAPSULE | Freq: Every day | ORAL | Status: AC
Start: 2015-04-13 — End: ?

## 2015-04-13 MED ORDER — ALPRAZOLAM 0.5 MG TAB
0.5 mg | ORAL_TABLET | Freq: Two times a day (BID) | ORAL | Status: AC | PRN
Start: 2015-04-13 — End: ?

## 2015-04-13 MED ORDER — BUPROPION XL 150 MG 24 HR TAB
150 mg | ORAL_TABLET | ORAL | Status: AC
Start: 2015-04-13 — End: ?

## 2015-04-13 NOTE — Progress Notes (Signed)
HISTORY OF PRESENT ILLNESS  Adriana Wilkins is a 34 y.o. female.  HPI   The patient is a 34 y.o. female who is seen for follow up of ADD, depression, anxiety.  Symptoms are stable.  The patient is following treatment regimen with good compliance. Stopped wellbutrin about 2 months ago -  Feels good. Might have a tiny bit of depression at times but not enough to make her thinks needs to restart med yet.  vyvanse working well, uses adderall in afternoon sometimes (not every day, 2 rx should last the 3 months). Xanax hasn't used it past month b/c ran out of it. Was just using it prn.  She might be moving soon for a new job. Is excited but also a little nervous about the change/transitions. Worries if depression comes back she would like to have the wellbutrin on hand in case needs to restart it    New problem: Right wrist pain and radiates up arm sometimes. Feels it if holding weight and when bends her wrist backwards. Wakes her up night sometimes. Driving Stick shift, texting makes it hurt. No injury.  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 No numbness in fingers.    Review of Systems   Constitutional: Negative for fever, chills and malaise/fatigue.   HENT: Negative for congestion, ear pain and sore throat.    Eyes: Negative for blurred vision, pain and redness.   Respiratory: Negative for cough and shortness of breath.    Cardiovascular: Negative for chest pain and palpitations.   Gastrointestinal: Negative for nausea, vomiting and abdominal pain.   Genitourinary: Negative for dysuria.   Musculoskeletal: Positive for joint pain. Negative for myalgias and back pain.   Skin: Negative for rash.   Neurological: Negative for dizziness and headaches.   Psychiatric/Behavioral: Negative for depression.     BP 111/73 mmHg   Pulse 103   Temp(Src) 98.3 ??F (36.8 ??C) (Oral)   Ht 5' 1.5" (1.562 m)   Wt 133 lb (60.328 kg)   BMI 24.73 kg/m2   SpO2 99%   LMP  03/23/2015 (Approximate)     Physical Exam   Constitutional: She is oriented to person, place, and time. She appears well-developed and well-nourished. She is cooperative. No distress.   HENT:   Head: Normocephalic and atraumatic.   Pulmonary/Chest: Effort normal. No respiratory distress.   Musculoskeletal:        Right wrist: She exhibits decreased range of motion and swelling (mild). She exhibits no tenderness, no effusion and no crepitus.   Very mild swelling ulnar side wrist, some pain with extension and rotation right wrist   Neurological: She is alert and oriented to person, place, and time. Coordination and gait normal.   Skin: No rash noted. She is not diaphoretic. No pallor.   Psychiatric: She has a normal mood and affect. Her speech is normal and behavior is normal. Judgment and thought content normal. Her mood appears not anxious. Cognition and memory are normal. She does not exhibit a depressed mood.       ASSESSMENT and PLAN    ICD-10-CM ICD-9-CM    1. ADD (attention deficit disorder) F90.0 314.00 Lisdexamfetamine (VYVANSE) 60 mg capsule      Lisdexamfetamine (VYVANSE) 60 mg capsule      Lisdexamfetamine (VYVANSE) 60 mg capsule      dextroamphetamine-amphetamine (ADDERALL) 20 mg tablet      dextroamphetamine-amphetamine (ADDERALL) 20 mg tablet    doing  much better on vyvnase . cont prn adderall that she uses prn in afternoons. she might be moving to different state for a job   2. Moderate single current episode of major depressive disorder (HCC) F32.1 296.22 buPROPion XL (WELLBUTRIN XL) 150 mg tablet    doing well off wellbutrin x 2 mos, but with possible move she is worried it could come back. if so restart wellbutrin and f/u   3. Right wrist pain M25.531 719.43     overuse injury - rec RICE, NSAID daily and should cont to slowly improve. 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

## 2015-10-07 ENCOUNTER — Encounter: Payer: Self-pay | Admitting: Physician Assistant

## 2015-10-07 ENCOUNTER — Ambulatory Visit: Payer: Self-pay

## 2015-10-07 ENCOUNTER — Ambulatory Visit (INDEPENDENT_AMBULATORY_CARE_PROVIDER_SITE_OTHER): Payer: Managed Care, Other (non HMO) | Admitting: Physician Assistant

## 2015-10-07 VITALS — BP 123/74 | HR 101 | Temp 98.4°F | Resp 16 | Ht 62.5 in | Wt 143.6 lb

## 2015-10-07 DIAGNOSIS — R519 Headache, unspecified: Secondary | ICD-10-CM

## 2015-10-07 DIAGNOSIS — R51 Headache: Secondary | ICD-10-CM | POA: Diagnosis not present

## 2015-10-07 DIAGNOSIS — Z3492 Encounter for supervision of normal pregnancy, unspecified, second trimester: Secondary | ICD-10-CM

## 2015-10-07 DIAGNOSIS — J309 Allergic rhinitis, unspecified: Secondary | ICD-10-CM

## 2015-10-07 MED ORDER — LORATADINE 10 MG PO TABS: 10.0000 mg | ORAL_TABLET | Freq: Every day | ORAL | Status: DC

## 2015-10-07 NOTE — Progress Notes (Signed)
[facility] [address], [place] [address] [phone]  Date:  10/07/2015   Name:  Dominique FloorChelsi Powers   DOB:  [birth year]   MRN:  [id number]  PCP:  No primary care provider on file.    Chief Complaint: Headache   History of Present Illness:  This is a 34 y.o. [redacted] week pregnant female with PMH ADD who is presenting with sinus pressure and intermittent headache x 1 week. Pressure is worse with bending forward. No tooth pain, facial pain or dizziness. Denies sore throat, cough, fever, chills. Otherwise she is feeling fine. She has had problems with her allergies and nasal congestion over the past 1 month. She started having a dull left earache 3 weeks ago. She found out she was pregnant [redacted] weeks ago. At that time she was taking adderall, vyvanse and claritin daily. She stopped all three of these at once. She has been taking tylenol for her headaches which help. She has a history of tension headaches but no problem with those in a couple months. She denies weakness, numbness, n/v.  This pregnancy was unplanned. [facility]. She has upcoming OB appt 2 weeks. She wants to get amniocentesis for genetic testing d/t fam hx hemophilia. She is excited about pregnancy but worried because she took stimulants and drank alcohol occ during first 12 weeks. She got married to her fiance after finding out she was pregnant. [place]. She is still looking for employment.  Review of Systems:  Review of Systems See HPI  There are no active problems to display for this patient.   Prior to Admission medications   Medication Sig Start Date End Date Taking? Authorizing Provider  acetaminophen (TYLENOL) 500 MG tablet Take 1,000 mg by mouth every 6 (six) hours as needed.   Yes Historical Provider, MD    Allergies  Allergen Reactions  . Amoxicillin Anaphylaxis    History reviewed. No pertinent past surgical  history.  Social History  Substance Use Topics  . Smoking status: Never Smoker   . Smokeless tobacco: None  . Alcohol Use: No    Family History  Problem Relation Age of Onset  . Hyperlipidemia Mother     Medication list has been reviewed and updated.  Physical Examination:  Physical Exam  Constitutional: She is oriented to person, place, and time. She appears well-developed and well-nourished. No distress.  HENT:  Head: Normocephalic and atraumatic.  Right Ear: Hearing, tympanic membrane, external ear and ear canal normal.  Left Ear: Hearing, tympanic membrane, external ear and ear canal normal.  Nose: Nose normal. Right sinus exhibits no maxillary sinus tenderness and no frontal sinus tenderness. Left sinus exhibits no maxillary sinus tenderness and no frontal sinus tenderness.  Mouth/Throat: Uvula is midline, oropharynx is clear and moist and mucous membranes are normal.  Eyes: Conjunctivae and lids are normal. Right eye exhibits no discharge. Left eye exhibits no discharge. No scleral icterus.  Cardiovascular: Normal rate, regular rhythm, normal heart sounds and normal pulses.   No murmur heard. Pulmonary/Chest: Effort normal and breath sounds normal. No respiratory distress. She has no wheezes. She has no rhonchi. She has no rales.  Musculoskeletal: Normal range of motion.  Lymphadenopathy:       Head (right side): No submental, no submandibular and no tonsillar adenopathy present.       Head (left side): No submental, no submandibular and no tonsillar adenopathy  present.    She has no cervical adenopathy.       Right: No supraclavicular adenopathy present.       Left: No supraclavicular adenopathy present.  Neurological: She is alert and oriented to person, place, and time. She has normal strength and normal reflexes. No cranial nerve deficit or sensory deficit. Gait normal.  Skin: Skin is warm, dry and intact. No lesion and no rash noted.  Psychiatric: She has a normal  mood and affect. Her speech is normal and behavior is normal. Thought content normal.    BP 123/74 mmHg  Pulse 101  Temp(Src) 98.4 F (36.9 C) (Oral)  Resp 16  Ht 5' 2.5" (1.588 m)  Wt 143 lb 9.6 oz (65.137 kg)  BMI 25.83 kg/m2  SpO2 98%  LMP 06/30/2015  Assessment and Plan:  1. Allergic rhinitis, unspecified allergic rhinitis type 2. nonintractable episodic headache Suspect headache is d/t sinus. I do not think she has a sinus infection though. She was taking claritin daily until 2 weeks ago when she stopped abruptly after finding out she was pregnant. She may go back on claritin, pregnancy category B. Also possible that her headaches are d/t caffeine withdrawal and abruptly stopping her adderall and vyvanse. She will let me know if headaches are not improving in 1 week. - loratadine (CLARITIN) 10 MG tablet; Take 1 tablet (10 mg total) by mouth daily.  Dispense: 30 tablet; Refill: 11  3. Second trimester pregnancy We discussed foods to avoid. She has a cat, we discussed her husband needs to clean the litter box. We discussed exercise. We discussed symptoms to watch out for. Follow up with OB in 2 weeks. Return as needed.   [place] Brackett, MHS [facility]  10/07/2015

## 2015-10-07 NOTE — Patient Instructions (Signed)
Start back on claritin daily. Let me know if in 10 days your headaches are not getting better - i would call in an antibiotic at that time.  Second Trimester of Pregnancy The second trimester is from week 13 through week 28, months 4 through 6. The second trimester is often a time when you feel your best. Your body has also adjusted to being pregnant, and you begin to feel better physically. Usually, morning sickness has lessened or quit completely, you may have more energy, and you may have an increase in appetite. The second trimester is also a time when the fetus is growing rapidly. At the end of the sixth month, the fetus is about 9 inches long and weighs about 1 pounds. You will likely begin to feel the baby move (quickening) between 18 and 20 weeks of the pregnancy. BODY CHANGES Your body goes through many changes during pregnancy. The changes vary from woman to woman.   Your weight will continue to increase. You will notice your lower abdomen bulging out.  You may begin to get stretch marks on your hips, abdomen, and breasts.  You may develop headaches that can be relieved by medicines approved by your health care provider.  You may urinate more often because the fetus is pressing on your bladder.  You may develop or continue to have heartburn as a result of your pregnancy.  You may develop constipation because certain hormones are causing the muscles that push waste through your intestines to slow down.  You may develop hemorrhoids or swollen, bulging veins (varicose veins).  You may have back pain because of the weight gain and pregnancy hormones relaxing your joints between the bones in your pelvis and as a result of a shift in weight and the muscles that support your balance.  Your breasts will continue to grow and be tender.  Your gums may bleed and may be sensitive to brushing and flossing.  Dark spots or blotches (chloasma, mask of pregnancy) may develop on your face. This  will likely fade after the baby is born.  A dark line from your belly button to the pubic area (linea nigra) may appear. This will likely fade after the baby is born.  You may have changes in your hair. These can include thickening of your hair, rapid growth, and changes in texture. Some women also have hair loss during or after pregnancy, or hair that feels dry or thin. Your hair will most likely return to normal after your baby is born. WHAT TO EXPECT AT YOUR PRENATAL VISITS During a routine prenatal visit:  You will be weighed to make sure you and the fetus are growing normally.  Your blood pressure will be taken.  Your abdomen will be measured to track your baby's growth.  The fetal heartbeat will be listened to.  Any test results from the previous visit will be discussed. Your health care provider may ask you:  How you are feeling.  If you are feeling the baby move.  If you have had any abnormal symptoms, such as leaking fluid, bleeding, severe headaches, or abdominal cramping.  If you are using any tobacco products, including cigarettes, chewing tobacco, and electronic cigarettes.  If you have any questions. Other tests that may be performed during your second trimester include:  Blood tests that check for:  Low iron levels (anemia).  Gestational diabetes (between 24 and 28 weeks).  Rh antibodies.  Urine tests to check for infections, diabetes, or protein in the  urine.  An ultrasound to confirm the proper growth and development of the baby.  An amniocentesis to check for possible genetic problems.  Fetal screens for spina bifida and Down syndrome.  HIV (human immunodeficiency virus) testing. Routine prenatal testing includes screening for HIV, unless you choose not to have this test. HOME CARE INSTRUCTIONS   Avoid all smoking, herbs, alcohol, and unprescribed drugs. These chemicals affect the formation and growth of the baby.  Do not use any tobacco products,  including cigarettes, chewing tobacco, and electronic cigarettes. If you need help quitting, ask your health care provider. You may receive counseling support and other resources to help you quit.  Follow your health care provider's instructions regarding medicine use. There are medicines that are either safe or unsafe to take during pregnancy.  Exercise only as directed by your health care provider. Experiencing uterine cramps is a good sign to stop exercising.  Continue to eat regular, healthy meals.  Wear a good support bra for breast tenderness.  Do not use hot tubs, steam rooms, or saunas.  Wear your seat belt at all times when driving.  Avoid raw meat, uncooked cheese, cat litter boxes, and soil used by cats. These carry germs that can cause birth defects in the baby.  Take your prenatal vitamins.  Take 1500-2000 mg of calcium daily starting at the 20th week of pregnancy until you deliver your baby.  Try taking a stool softener (if your health care provider approves) if you develop constipation. Eat more high-fiber foods, such as fresh vegetables or fruit and whole grains. Drink plenty of fluids to keep your urine clear or pale yellow.  Take warm sitz baths to soothe any pain or discomfort caused by hemorrhoids. Use hemorrhoid cream if your health care provider approves.  If you develop varicose veins, wear support hose. Elevate your feet for 15 minutes, 3-4 times a day. Limit salt in your diet.  Avoid heavy lifting, wear low heel shoes, and practice good posture.  Rest with your legs elevated if you have leg cramps or low back pain.  Visit your dentist if you have not gone yet during your pregnancy. Use a soft toothbrush to brush your teeth and be gentle when you floss.  A sexual relationship may be continued unless your health care provider directs you otherwise.  Continue to go to all your prenatal visits as directed by your health care provider. SEEK MEDICAL CARE IF:    You have dizziness.  You have mild pelvic cramps, pelvic pressure, or nagging pain in the abdominal area.  You have persistent nausea, vomiting, or diarrhea.  You have a bad smelling vaginal discharge.  You have pain with urination. SEEK IMMEDIATE MEDICAL CARE IF:   You have a fever.  You are leaking fluid from your vagina.  You have spotting or bleeding from your vagina.  You have severe abdominal cramping or pain.  You have rapid weight gain or loss.  You have shortness of breath with chest pain.  You notice sudden or extreme swelling of your face, hands, ankles, feet, or legs.  You have not felt your baby move in over an hour.  You have severe headaches that do not go away with medicine.  You have vision changes.   This information is not intended to replace advice given to you by your health care provider. Make sure you discuss any questions you have with your health care provider.   Document Released: 09/27/2001 Document Revised: 10/24/2014 Document Reviewed: 12/04/2012  Chartered certified accountant Patient Education [employer].

## 2015-10-21 ENCOUNTER — Encounter: Payer: Self-pay | Admitting: Family

## 2015-10-21 ENCOUNTER — Ambulatory Visit (INDEPENDENT_AMBULATORY_CARE_PROVIDER_SITE_OTHER): Payer: Managed Care, Other (non HMO) | Admitting: Family

## 2015-10-21 VITALS — BP 135/84 | HR 107 | Temp 98.4°F | Wt 146.9 lb

## 2015-10-21 DIAGNOSIS — Z3490 Encounter for supervision of normal pregnancy, unspecified, unspecified trimester: Secondary | ICD-10-CM | POA: Insufficient documentation

## 2015-10-21 DIAGNOSIS — Z832 Family history of diseases of the blood and blood-forming organs and certain disorders involving the immune mechanism: Secondary | ICD-10-CM

## 2015-10-21 DIAGNOSIS — Z1151 Encounter for screening for human papillomavirus (HPV): Secondary | ICD-10-CM | POA: Diagnosis not present

## 2015-10-21 DIAGNOSIS — Z3482 Encounter for supervision of other normal pregnancy, second trimester: Secondary | ICD-10-CM | POA: Diagnosis not present

## 2015-10-21 DIAGNOSIS — Z3492 Encounter for supervision of normal pregnancy, unspecified, second trimester: Secondary | ICD-10-CM

## 2015-10-21 DIAGNOSIS — Z3480 Encounter for supervision of other normal pregnancy, unspecified trimester: Secondary | ICD-10-CM

## 2015-10-21 DIAGNOSIS — Z124 Encounter for screening for malignant neoplasm of cervix: Secondary | ICD-10-CM

## 2015-10-21 DIAGNOSIS — Z113 Encounter for screening for infections with a predominantly sexual mode of transmission: Secondary | ICD-10-CM

## 2015-10-21 LAB — POCT URINALYSIS DIP (DEVICE)
Bilirubin Urine: NEGATIVE
GLUCOSE, UA: NEGATIVE mg/dL
KETONES UR: NEGATIVE mg/dL
Leukocytes, UA: NEGATIVE
Nitrite: NEGATIVE
PH: 7 (ref 5.0–8.0)
PROTEIN: NEGATIVE mg/dL
SPECIFIC GRAVITY, URINE: 1.015 (ref 1.005–1.030)
UROBILINOGEN UA: 0.2 mg/dL (ref 0.0–1.0)

## 2015-10-21 LAB — OB RESULTS CONSOLE GC/CHLAMYDIA: GC PROBE AMP, GENITAL: NEGATIVE

## 2015-10-21 NOTE — Progress Notes (Signed)
Anatomy US scheduled January 25th @ 0815 New ob packet given

## 2015-10-21 NOTE — Progress Notes (Signed)
Pain- "cramping"    

## 2015-10-21 NOTE — Progress Notes (Signed)
   Subjective:    Dominique Powers is a G3P0020 [id number] being seen today for her first obstetrical visit.   Patient does intend to breast feed. Pregnancy history fully reviewed.  [facility], uncertain LMP.    Patient reports no complaints.  Filed Vitals:   10/21/15 0829  BP: 135/84  Pulse: 107  Temp: 98.4 F (36.9 C)  Weight: 146 lb 14.4 oz (66.633 kg)    HISTORY: OB History  Gravida Para Term Preterm AB SAB TAB Ectopic Multiple Living  3    2  2        # Outcome Date GA Lbr Len/2nd Weight Sex Delivery Anes PTL Lv  3 Current           2 TAB           1 TAB              Past Medical History  Diagnosis Date  . ADD (attention deficit disorder)    Past Surgical History  Procedure Laterality Date  . Mole removal     Family History  Problem Relation Age of Onset  . Hyperlipidemia Mother   . Hemophilia Maternal Uncle      Exam    BP 135/84 mmHg  Pulse 107  Temp(Src) 98.4 F (36.9 C)  Wt 146 lb 14.4 oz (66.633 kg)  LMP 06/29/2015 (Approximate) Uterine Size: size equals dates  Pelvic Exam:    Perineum: No Hemorrhoids, Normal Perineum   Vulva: normal   Vagina:  normal mucosa, normal discharge, no palpable nodules   pH: Not done   Cervix: no bleeding following Pap, no cervical motion tenderness and no lesions   Adnexa: normal adnexa and no mass, fullness, tenderness   Bony Pelvis: Adequate  System: Breast:  No nipple retraction or dimpling, No nipple discharge or bleeding, No axillary or supraclavicular adenopathy, Normal to palpation without dominant masses   Skin: normal coloration and turgor, no rashes    Neurologic: negative   Extremities: normal strength, tone, and muscle mass   HEENT neck supple with midline trachea and thyroid without masses   Mouth/Teeth mucous membranes moist, pharynx normal without lesions   Neck supple and no masses   Cardiovascular: regular rate and rhythm, no murmurs or gallops   Respiratory:   appears well, vitals normal, no respiratory distress, acyanotic, normal RR, neck free of mass or lymphadenopathy, chest clear, no wheezing, crepitations, rhonchi, normal symmetric air entry   Abdomen: soft, non-tender; bowel sounds normal; no masses,  no organomegaly   Urinary: urethral meatus normal       Assessment:    Pregnancy: [id number] Patient Active Problem List   Diagnosis Date Noted  . Supervision of normal pregnancy, antepartum 10/21/2015     1. Family History of Hemophilia  - Referral to MFM for genetic consultation    Plan:     Initial labs drawn. Prenatal vitamins. Problem list reviewed and updated. Genetic Screening discussed Quad Screen: ordered.  Ultrasound discussed; fetal survey: ordered.  Follow up in 4 weeks.   Marlis EdelsonKARIM, WALIDAH N 10/21/2015

## 2015-10-21 NOTE — Patient Instructions (Signed)
Second Trimester of Pregnancy The second trimester is from week 13 through week 28, months 4 through 6. The second trimester is often a time when you feel your best. Your body has also adjusted to being pregnant, and you begin to feel better physically. Usually, morning sickness has lessened or quit completely, you may have more energy, and you may have an increase in appetite. The second trimester is also a time when the fetus is growing rapidly. At the end of the sixth month, the fetus is about 9 inches long and weighs about 1 pounds. You will likely begin to feel the baby move (quickening) between 18 and 20 weeks of the pregnancy. BODY CHANGES Your body goes through many changes during pregnancy. The changes vary from woman to woman.   Your weight will continue to increase. You will notice your lower abdomen bulging out.  You may begin to get stretch marks on your hips, abdomen, and breasts.  You may develop headaches that can be relieved by medicines approved by your health care provider.  You may urinate more often because the fetus is pressing on your bladder.  You may develop or continue to have heartburn as a result of your pregnancy.  You may develop constipation because certain hormones are causing the muscles that push waste through your intestines to slow down.  You may develop hemorrhoids or swollen, bulging veins (varicose veins).  You may have back pain because of the weight gain and pregnancy hormones relaxing your joints between the bones in your pelvis and as a result of a shift in weight and the muscles that support your balance.  Your breasts will continue to grow and be tender.  Your gums may bleed and may be sensitive to brushing and flossing.  Dark spots or blotches (chloasma, mask of pregnancy) may develop on your face. This will likely fade after the baby is born.  A dark line from your belly button to the pubic area (linea nigra) may appear. This will likely  fade after the baby is born.  You may have changes in your hair. These can include thickening of your hair, rapid growth, and changes in texture. Some women also have hair loss during or after pregnancy, or hair that feels dry or thin. Your hair will most likely return to normal after your baby is born. WHAT TO EXPECT AT YOUR PRENATAL VISITS During a routine prenatal visit:  You will be weighed to make sure you and the fetus are growing normally.  Your blood pressure will be taken.  Your abdomen will be measured to track your baby's growth.  The fetal heartbeat will be listened to.  Any test results from the previous visit will be discussed. Your health care provider may ask you:  How you are feeling.  If you are feeling the baby move.  If you have had any abnormal symptoms, such as leaking fluid, bleeding, severe headaches, or abdominal cramping.  If you are using any tobacco products, including cigarettes, chewing tobacco, and electronic cigarettes.  If you have any questions. Other tests that may be performed during your second trimester include:  Blood tests that check for:  Low iron levels (anemia).  Gestational diabetes (between 24 and 28 weeks).  Rh antibodies.  Urine tests to check for infections, diabetes, or protein in the urine.  An ultrasound to confirm the proper growth and development of the baby.  An amniocentesis to check for possible genetic problems.  Fetal screens for spina bifida   and Down syndrome.  HIV (human immunodeficiency virus) testing. Routine prenatal testing includes screening for HIV, unless you choose not to have this test. HOME CARE INSTRUCTIONS   Avoid all smoking, herbs, alcohol, and unprescribed drugs. These chemicals affect the formation and growth of the baby.  Do not use any tobacco products, including cigarettes, chewing tobacco, and electronic cigarettes. If you need help quitting, ask your health care provider. You may receive  counseling support and other resources to help you quit.  Follow your health care provider's instructions regarding medicine use. There are medicines that are either safe or unsafe to take during pregnancy.  Exercise only as directed by your health care provider. Experiencing uterine cramps is a good sign to stop exercising.  Continue to eat regular, healthy meals.  Wear a good support bra for breast tenderness.  Do not use hot tubs, steam rooms, or saunas.  Wear your seat belt at all times when driving.  Avoid raw meat, uncooked cheese, cat litter boxes, and soil used by cats. These carry germs that can cause birth defects in the baby.  Take your prenatal vitamins.  Take 1500-2000 mg of calcium daily starting at the 20th week of pregnancy until you deliver your baby.  Try taking a stool softener (if your health care provider approves) if you develop constipation. Eat more high-fiber foods, such as fresh vegetables or fruit and whole grains. Drink plenty of fluids to keep your urine clear or pale yellow.  Take warm sitz baths to soothe any pain or discomfort caused by hemorrhoids. Use hemorrhoid cream if your health care provider approves.  If you develop varicose veins, wear support hose. Elevate your feet for 15 minutes, 3-4 times a day. Limit salt in your diet.  Avoid heavy lifting, wear low heel shoes, and practice good posture.  Rest with your legs elevated if you have leg cramps or low back pain.  Visit your dentist if you have not gone yet during your pregnancy. Use a soft toothbrush to brush your teeth and be gentle when you floss.  A sexual relationship may be continued unless your health care provider directs you otherwise.  Continue to go to all your prenatal visits as directed by your health care provider. SEEK MEDICAL CARE IF:   You have dizziness.  You have mild pelvic cramps, pelvic pressure, or nagging pain in the abdominal area.  You have persistent nausea,  vomiting, or diarrhea.  You have a bad smelling vaginal discharge.  You have pain with urination. SEEK IMMEDIATE MEDICAL CARE IF:   You have a fever.  You are leaking fluid from your vagina.  You have spotting or bleeding from your vagina.  You have severe abdominal cramping or pain.  You have rapid weight gain or loss.  You have shortness of breath with chest pain.  You notice sudden or extreme swelling of your face, hands, ankles, feet, or legs.  You have not felt your baby move in over an hour.  You have severe headaches that do not go away with medicine.  You have vision changes.   This information is not intended to replace advice given to you by your health care provider. Make sure you discuss any questions you have with your health care provider.   Document Released: 09/27/2001 Document Revised: 10/24/2014 Document Reviewed: 12/04/2012 [employer] Interactive Patient Education 2016 [employer]   Breastfeeding Deciding to breastfeed is one of the best choices you can make for you and your baby. A change   in hormones during pregnancy causes your breast tissue to grow and increases the number and size of your milk ducts. These hormones also allow proteins, sugars, and fats from your blood supply to make breast milk in your milk-producing glands. Hormones prevent breast milk from being released before your baby is born as well as prompt milk flow after birth. Once breastfeeding has begun, thoughts of your baby, as well as his or her sucking or crying, can stimulate the release of milk from your milk-producing glands.  BENEFITS OF BREASTFEEDING For Your Baby  Your first milk (colostrum) helps your baby's digestive system function better.  There are antibodies in your milk that help your baby fight off infections.  Your baby has a lower incidence of asthma, allergies, and sudden infant death syndrome.  The nutrients in breast milk are better for your baby than infant  formulas and are designed uniquely for your baby's needs.  Breast milk improves your baby's brain development.  Your baby is less likely to develop other conditions, such as childhood obesity, asthma, or type 2 diabetes mellitus. For You  Breastfeeding helps to create a very special bond between you and your baby.  Breastfeeding is convenient. Breast milk is always available at the correct temperature and costs nothing.  Breastfeeding helps to burn calories and helps you lose the weight gained during pregnancy.  Breastfeeding makes your uterus contract to its prepregnancy size faster and slows bleeding (lochia) after you give birth.   Breastfeeding helps to lower your risk of developing type 2 diabetes mellitus, osteoporosis, and breast or ovarian cancer later in life. SIGNS THAT YOUR BABY IS HUNGRY Early Signs of Hunger  Increased alertness or activity.  Stretching.  Movement of the head from side to side.  Movement of the head and opening of the mouth when the corner of the mouth or cheek is stroked (rooting).  Increased sucking sounds, smacking lips, cooing, sighing, or squeaking.  Hand-to-mouth movements.  Increased sucking of fingers or hands. Late Signs of Hunger  Fussing.  Intermittent crying. Extreme Signs of Hunger Signs of extreme hunger will require calming and consoling before your baby will be able to breastfeed successfully. Do not wait for the following signs of extreme hunger to occur before you initiate breastfeeding:  Restlessness.  A loud, strong cry.  Screaming. BREASTFEEDING BASICS Breastfeeding Initiation  Find a comfortable place to sit or lie down, with your neck and back well supported.  Place a pillow or rolled up blanket under your baby to bring him or her to the level of your breast (if you are seated). Nursing pillows are specially designed to help support your arms and your baby while you breastfeed.  Make sure that your baby's  abdomen is facing your abdomen.  Gently massage your breast. With your fingertips, massage from your chest wall toward your nipple in a circular motion. This encourages milk flow. You may need to continue this action during the feeding if your milk flows slowly.  Support your breast with 4 fingers underneath and your thumb above your nipple. Make sure your fingers are well away from your nipple and your baby's mouth.  Stroke your baby's lips gently with your finger or nipple.  When your baby's mouth is open wide enough, quickly bring your baby to your breast, placing your entire nipple and as much of the colored area around your nipple (areola) as possible into your baby's mouth.  More areola should be visible above your baby's upper lip than   below the lower lip.  Your baby's tongue should be between his or her lower gum and your breast.  Ensure that your baby's mouth is correctly positioned around your nipple (latched). Your baby's lips should create a seal on your breast and be turned out (everted).  It is common for your baby to suck about 2-3 minutes in order to start the flow of breast milk. Latching Teaching your baby how to latch on to your breast properly is very important. An improper latch can cause nipple pain and decreased milk supply for you and poor weight gain in your baby. Also, if your baby is not latched onto your nipple properly, he or she may swallow some air during feeding. This can make your baby fussy. Burping your baby when you switch breasts during the feeding can help to get rid of the air. However, teaching your baby to latch on properly is still the best way to prevent fussiness from swallowing air while breastfeeding. Signs that your baby has successfully latched on to your nipple:  Silent tugging or silent sucking, without causing you pain.  Swallowing heard between every 3-4 sucks.  Muscle movement above and in front of his or her ears while sucking. Signs  that your baby has not successfully latched on to nipple:  Sucking sounds or smacking sounds from your baby while breastfeeding.  Nipple pain. If you think your baby has not latched on correctly, slip your finger into the corner of your baby's mouth to break the suction and place it between your baby's gums. Attempt breastfeeding initiation again. Signs of Successful Breastfeeding Signs from your baby:  A gradual decrease in the number of sucks or complete cessation of sucking.  Falling asleep.  Relaxation of his or her body.  Retention of a small amount of milk in his or her mouth.  Letting go of your breast by himself or herself. Signs from you:  Breasts that have increased in firmness, weight, and size 1-3 hours after feeding.  Breasts that are softer immediately after breastfeeding.  Increased milk volume, as well as a change in milk consistency and color by the fifth day of breastfeeding.  Nipples that are not sore, cracked, or bleeding. Signs That Your Baby is Getting Enough Milk  Wetting at least 3 diapers in a 24-hour period. The urine should be clear and pale yellow by age 5 days.  At least 3 stools in a 24-hour period by age 5 days. The stool should be soft and yellow.  At least 3 stools in a 24-hour period by age 7 days. The stool should be seedy and yellow.  No loss of weight greater than 10% of birth weight during the first 3 days of age.  Average weight gain of 4-7 ounces (113-198 g) per week after age 4 days.  Consistent daily weight gain by age 5 days, without weight loss after the age of 2 weeks. After a feeding, your baby may spit up a small amount. This is common. BREASTFEEDING FREQUENCY AND DURATION Frequent feeding will help you make more milk and can prevent sore nipples and breast engorgement. Breastfeed when you feel the need to reduce the fullness of your breasts or when your baby shows signs of hunger. This is called "breastfeeding on demand." Avoid  introducing a pacifier to your baby while you are working to establish breastfeeding (the first 4-6 weeks after your baby is born). After this time you may choose to use a pacifier. Research has shown that   pacifier use during the first year of a baby's life decreases the risk of sudden infant death syndrome (SIDS). Allow your baby to feed on each breast as long as he or she wants. Breastfeed until your baby is finished feeding. When your baby unlatches or falls asleep while feeding from the first breast, offer the second breast. Because newborns are often sleepy in the first few weeks of life, you may need to awaken your baby to get him or her to feed. Breastfeeding times will vary from baby to baby. However, the following rules can serve as a guide to help you ensure that your baby is properly fed:  Newborns (babies 4 weeks of age or younger) may breastfeed every 1-3 hours.  Newborns should not go longer than 3 hours during the day or 5 hours during the night without breastfeeding.  You should breastfeed your baby a minimum of 8 times in a 24-hour period until you begin to introduce solid foods to your baby at around 6 months of age. BREAST MILK PUMPING Pumping and storing breast milk allows you to ensure that your baby is exclusively fed your breast milk, even at times when you are unable to breastfeed. This is especially important if you are going back to work while you are still breastfeeding or when you are not able to be present during feedings. Your lactation consultant can give you guidelines on how long it is safe to store breast milk. A breast pump is a machine that allows you to pump milk from your breast into a sterile bottle. The pumped breast milk can then be stored in a refrigerator or freezer. Some breast pumps are operated by hand, while others use electricity. Ask your lactation consultant which type will work best for you. Breast pumps can be purchased, but some hospitals and  breastfeeding support groups lease breast pumps on a monthly basis. A lactation consultant can teach you how to hand express breast milk, if you prefer not to use a pump. CARING FOR YOUR BREASTS WHILE YOU BREASTFEED Nipples can become dry, cracked, and sore while breastfeeding. The following recommendations can help keep your breasts moisturized and healthy:  Avoid using soap on your nipples.  Wear a supportive bra. Although not required, special nursing bras and tank tops are designed to allow access to your breasts for breastfeeding without taking off your entire bra or top. Avoid wearing underwire-style bras or extremely tight bras.  Air dry your nipples for 3-4minutes after each feeding.  Use only cotton bra pads to absorb leaked breast milk. Leaking of breast milk between feedings is normal.  Use lanolin on your nipples after breastfeeding. Lanolin helps to maintain your skin's normal moisture barrier. If you use pure lanolin, you do not need to wash it off before feeding your baby again. Pure lanolin is not toxic to your baby. You may also hand express a few drops of breast milk and gently massage that milk into your nipples and allow the milk to air dry. In the first few weeks after giving birth, some women experience extremely full breasts (engorgement). Engorgement can make your breasts feel heavy, warm, and tender to the touch. Engorgement peaks within 3-5 days after you give birth. The following recommendations can help ease engorgement:  Completely empty your breasts while breastfeeding or pumping. You may want to start by applying warm, moist heat (in the shower or with warm water-soaked hand towels) just before feeding or pumping. This increases circulation and helps the milk   flow. If your baby does not completely empty your breasts while breastfeeding, pump any extra milk after he or she is finished.  Wear a snug bra (nursing or regular) or tank top for 1-2 days to signal your body  to slightly decrease milk production.  Apply ice packs to your breasts, unless this is too uncomfortable for you.  Make sure that your baby is latched on and positioned properly while breastfeeding. If engorgement persists after 48 hours of following these recommendations, contact your health care provider or a lactation consultant. OVERALL HEALTH CARE RECOMMENDATIONS WHILE BREASTFEEDING  Eat healthy foods. Alternate between meals and snacks, eating 3 of each per day. Because what you eat affects your breast milk, some of the foods may make your baby more irritable than usual. Avoid eating these foods if you are sure that they are negatively affecting your baby.  Drink milk, fruit juice, and water to satisfy your thirst (about 10 glasses a day).  Rest often, relax, and continue to take your prenatal vitamins to prevent fatigue, stress, and anemia.  Continue breast self-awareness checks.  Avoid chewing and smoking tobacco. Chemicals from cigarettes that pass into breast milk and exposure to secondhand smoke may harm your baby.  Avoid alcohol and drug use, including marijuana. Some medicines that may be harmful to your baby can pass through breast milk. It is important to ask your health care provider before taking any medicine, including all over-the-counter and prescription medicine as well as vitamin and herbal supplements. It is possible to become pregnant while breastfeeding. If birth control is desired, ask your health care provider about options that will be safe for your baby. SEEK MEDICAL CARE IF:  You feel like you want to stop breastfeeding or have become frustrated with breastfeeding.  You have painful breasts or nipples.  Your nipples are cracked or bleeding.  Your breasts are red, tender, or warm.  You have a swollen area on either breast.  You have a fever or chills.  You have nausea or vomiting.  You have drainage other than breast milk from your nipples.  Your  breasts do not become full before feedings by the fifth day after you give birth.  You feel sad and depressed.  Your baby is too sleepy to eat well.  Your baby is having trouble sleeping.   Your baby is wetting less than 3 diapers in a 24-hour period.  Your baby has less than 3 stools in a 24-hour period.  Your baby's skin or the white part of his or her eyes becomes yellow.   Your baby is not gaining weight by 5 days of age. SEEK IMMEDIATE MEDICAL CARE IF:  Your baby is overly tired (lethargic) and does not want to wake up and feed.  Your baby develops an unexplained fever.   This information is not intended to replace advice given to you by your health care provider. Make sure you discuss any questions you have with your health care provider.   Document Released: 10/03/2005 Document Revised: 06/24/2015 Document Reviewed: 03/27/2013 [employer] Interactive Patient Education 2016 [employer]  

## 2015-10-22 ENCOUNTER — Ambulatory Visit (HOSPITAL_COMMUNITY)
Admission: RE | Admit: 2015-10-22 | Discharge: 2015-10-22 | Disposition: A | Discharge status: Home / Self Care | Payer: Managed Care, Other (non HMO) | Source: Ambulatory Visit | Attending: Family | Admitting: Family

## 2015-10-22 ENCOUNTER — Encounter (HOSPITAL_COMMUNITY): Payer: Self-pay

## 2015-10-22 ENCOUNTER — Encounter (HOSPITAL_COMMUNITY): Payer: Self-pay | Admitting: MS"

## 2015-10-22 ENCOUNTER — Other Ambulatory Visit (HOSPITAL_COMMUNITY): Payer: Self-pay | Admitting: Maternal and Fetal Medicine

## 2015-10-22 VITALS — BP 132/74 | HR 101 | Wt 149.6 lb

## 2015-10-22 DIAGNOSIS — Z3A15 15 weeks gestation of pregnancy: Secondary | ICD-10-CM | POA: Insufficient documentation

## 2015-10-22 DIAGNOSIS — O09529 Supervision of elderly multigravida, unspecified trimester: Secondary | ICD-10-CM

## 2015-10-22 DIAGNOSIS — Z36 Encounter for antenatal screening of mother: Secondary | ICD-10-CM | POA: Diagnosis not present

## 2015-10-22 DIAGNOSIS — Z315 Encounter for genetic counseling: Secondary | ICD-10-CM

## 2015-10-22 DIAGNOSIS — O09522 Supervision of elderly multigravida, second trimester: Secondary | ICD-10-CM

## 2015-10-22 DIAGNOSIS — D66 Hereditary factor VIII deficiency: Secondary | ICD-10-CM

## 2015-10-22 DIAGNOSIS — Z3A16 16 weeks gestation of pregnancy: Secondary | ICD-10-CM | POA: Diagnosis not present

## 2015-10-22 DIAGNOSIS — Z1401 Asymptomatic hemophilia A carrier: Secondary | ICD-10-CM

## 2015-10-22 DIAGNOSIS — Z3689 Encounter for other specified antenatal screening: Secondary | ICD-10-CM

## 2015-10-22 LAB — CULTURE, OB URINE
COLONY COUNT: NO GROWTH
Organism ID, Bacteria: NO GROWTH

## 2015-10-22 LAB — PRESCRIPTION MONITORING PROFILE (19 PANEL)
Amphetamine/Meth: NEGATIVE ng/mL
Barbiturate Screen, Urine: NEGATIVE ng/mL
Benzodiazepine Screen, Urine: NEGATIVE ng/mL
Buprenorphine, Urine: NEGATIVE ng/mL
CARISOPRODOL, URINE: NEGATIVE ng/mL
COCAINE METABOLITES: NEGATIVE ng/mL
CREATININE, URINE: 83.62 mg/dL (ref >20.0)
Cannabinoid Scrn, Ur: NEGATIVE ng/mL
ECSTASY: NEGATIVE ng/mL
FENTANYL URINE: NEGATIVE ng/mL
Meperidine, Ur: NEGATIVE ng/mL
Methadone Screen, Urine: NEGATIVE ng/mL
Methaqualone: NEGATIVE ng/mL
NITRITES URINE, INITIAL: NEGATIVE ug/mL
OPIATE SCREEN, URINE: NEGATIVE ng/mL
OXYCODONE SCRN UR: NEGATIVE ng/mL
PH URINE, INITIAL: 6.7 pH (ref 4.5–8.9)
PROPOXYPHENE: NEGATIVE ng/mL
Phencyclidine, Ur: NEGATIVE ng/mL
TRAMADOL UR: NEGATIVE ng/mL
Tapentadol, urine: NEGATIVE ng/mL
ZOLPIDEM, URINE: NEGATIVE ng/mL

## 2015-10-22 LAB — GC/CHLAMYDIA PROBE AMP (~~LOC~~) NOT AT ARMC
Chlamydia: NEGATIVE
Neisseria Gonorrhea: NEGATIVE

## 2015-10-22 LAB — CHROMOSOMES ANALYSIS FOR CF

## 2015-10-23 ENCOUNTER — Encounter (HOSPITAL_COMMUNITY): Payer: Self-pay

## 2015-10-23 DIAGNOSIS — O09529 Supervision of elderly multigravida, unspecified trimester: Secondary | ICD-10-CM | POA: Insufficient documentation

## 2015-10-23 LAB — PRENATAL PROFILE (SOLSTAS)
ANTIBODY SCREEN: NEGATIVE
BASOS ABS: 0 10*3/uL (ref 0.0–0.1)
Basophils Relative: 0 % (ref 0–1)
EOS ABS: 0.1 10*3/uL (ref 0.0–0.7)
EOS PCT: 1 % (ref 0–5)
HCT: 40.1 % (ref 36.0–46.0)
HIV: NONREACTIVE
Hemoglobin: 13.7 g/dL (ref 12.0–15.0)
Hepatitis B Surface Ag: NEGATIVE
LYMPHS ABS: 2.6 10*3/uL (ref 0.7–4.0)
LYMPHS PCT: 26 % (ref 12–46)
MCH: 30.4 pg (ref 26.0–34.0)
MCHC: 34.2 g/dL (ref 30.0–36.0)
MCV: 89.1 fL (ref 78.0–100.0)
MONO ABS: 0.7 10*3/uL (ref 0.1–1.0)
MONOS PCT: 7 % (ref 3–12)
MPV: 10 fL (ref 8.6–12.4)
Neutro Abs: 6.7 10*3/uL (ref 1.7–7.7)
Neutrophils Relative %: 66 % (ref 43–77)
PLATELETS: 255 10*3/uL (ref 150–400)
RBC: 4.5 MIL/uL (ref 3.87–5.11)
RDW: 12.8 % (ref 11.5–15.5)
RH TYPE: NEGATIVE
RUBELLA: 2.27 {index} — AB (ref <0.90)
WBC: 10.1 10*3/uL (ref 4.0–10.5)

## 2015-10-23 LAB — AFP, QUAD SCREEN
AFP: 30.1 ng/mL
Age Alone: 1:280 {titer}
Curr Gest Age: 16.2 wks.days
HCG, Total: 112.57 IU/mL
INH: 339.4 pg/mL
INTERPRETATION-AFP: POSITIVE — AB
MOM FOR INH: 1.91
MoM for AFP: 0.88
MoM for hCG: 2.9
Open Spina bifida: NEGATIVE
TRI 18 SCR RISK EST: NEGATIVE
UE3 MOM: 0.61
UE3 VALUE: 0.54 ng/mL

## 2015-10-23 LAB — CYTOLOGY - PAP

## 2015-10-23 NOTE — Progress Notes (Signed)
Genetic Counseling  High-Risk Gestation Note  Appointment Date:  10/23/2015 Referred By: Marlis Edelson, CNM Date of Birth:  05-Jun-1981   Pregnancy History: G3P0020 Estimated Date of Delivery: 04/08/16 Estimated Gestational Age: [redacted]w[redacted]d Attending: Particia Nearing, MD   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  Reviewed 1 in 4 (25%) chance for affected female with each pregnancy, given X-linked recessive inheritance of hemophilia A  Prenatal diagnosis is available via amniocentesis given that genetic testing was previously done and identified causative change in patient  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s are at increased risk for delayed bleeding postpartum   Advanced maternal age ~ 1 in 38 risk for fetal aneuploidy related to maternal age of 35 years old  Quad screen drawn through [facility] office yesterday and results pending  Discussed additional screening options of detailed ultrasound and NIPS and diagnostic option of amniocentesis   Patient elected to assess fetal sex via ultrasound   If female fetus, she planned to pursue amniocentesis for chromosomes and hemophilia A testing   If female fetus, she planned to forego amniocentesis at this time and pursue NIPS (Panorama) for fetal aneuploidy screening  Limited ultrasound performed today; Fetus appeared female  Patient elected to have NIPS (Panorama) and cystic fibrosis carrier screen performed today and declined amniocentesis at this time  Follow-up ultrasound is scheduled for 11/11/15  Alcohol exposure in first trimester (first 10 weeks); offer detailed ultrasound and fetal  echocardiogram  Both family histories were reviewed and found to be contributory for Hemophilia A. The patient reported that she has two maternal female first cousins once removed (related through females) with hemophilia A. [name] and her mother both were evaluated through [facility] in 2003 and had genetic carrier testing. They were both identified to be carriers for the common inversion in F8 gene. The patient provided medical documentation of the visits with [facility] that state the results of testing; however, the lab results from 2003 were not available at this time.   Hemophilia A is a genetic condition characterized by a deficiency of the Factor VIII protein, which allows the blood to clot normally.  If this protein does not work as it should (caused by changes in the Factor VIII, F8, gene on the X chromosome), this will result in excessive and prolonged bleeding and easy bruising.  Hemophilia can be treated through injections of clotting factors as needed or to prevent bleeds from occurring, but there is no cure at this time. We reviewed genes, chromosomes, and that hemophilia A follows X-linked recessive inheritance. A female "carrier" refers to a woman with one working copy and one nonworking copy of their F8 gene for hemophilia.  Carriers may have no symptoms or mild symptoms of the condition. It is estimated that 10% of female carriers display symptoms.      Each time a hemophilia A carrier female is pregnant, the pregnancy has a new chance for each of the following outcomes: (1) a noncarrier female, (2) a carrier female like the mother with no symptoms to mild symptoms, (3) an unaffected female (not a carrier), or (4) a female with hemophilia. Males with hemophilia A  would be expected to have carrier daughters and unaffected sons. Given the patient's report that hemophilia A carrier status has been confirmed in her, each pregnancy for the couple would have a 1 in 4 (25%) chance to be an  affected female.   Hemophilia is typically diagnosed with low factor VIII clotting activity.  Measuring factor VIII or IX levels is not reliable for carrier detection, as there can be considerable overlap between carriers and noncarriers.  Molecular genetic testing of the factor VIII gene is available and detects disease-causing mutations in approximately 98% of affected individuals.  When a causative mutation has been identified in an affected relative, carrier testing becomes informative for other at-risk relatives.  Prenatal diagnosis via chorionic villus sampling (CVS) or amniocentesis is available for at-risk female pregnancies when the familial mutation has been identified. There are various gene changes in the F8 gene that have been identified to cause hemophilia A. F8 intron 22 inversions are associated with severe hemophilia A and account for approximately 45% of severe cases. There is also a reported inversion in intron 1 of F8 that is associated with the severe phenotype. Given the patient's documentation from her 2003 visit, it is most likely that she carries either the intron 22 inversion or possibly the intron 1 inversion. Additional documentation of the laboratory results from 2003 would be helpful to clarify the specific F8 gene change in the family. We discussed the risks, benefits, and limitations of amniocentesis including the associated 1 in 300-500 risk for complications, including spontaneous pregnancy loss.  [name] stated that she would be interested in prenatal diagnosis for hemophilia A via amniocentesis in the case that a female fetus was identified to be present in the pregnancy. It would be important for the couple's pediatrician to be aware of this history. In the case of a female pregnancy, we reviewed the option of measuring factor VIII or factor IX activity (depending upon the type of hemophilia present in the family) postnatally and prior to circumcision.  We discussed the options  of second trimester ultrasound to assess for phenotypic fetal sex and for noninvasive prenatal screening (NIPS)/cell free DNA testing to assess for fetal sex by assessing X and Y chromosome material.  We discussed the benefits and limitations of these screens. She understands that ultrasound and NIPS do no assess for hemophilia A but rather would refine recurrence risk by assessing fetal sex. Also, we discussed with [name] that carriers for hemophilia, have an increased chance for delayed postpartum bleeding. Thus, it would be important for her physician to be aware of her carrier status and for her to be monitored appropriately in the postpartum period.   She was also counseled regarding maternal age and the association with risk for chromosome conditions due to nondisjunction with aging of the ova.   We reviewed chromosomes, nondisjunction, and the associated 1 in 141 risk for fetal aneuploidy related to a maternal age of 35 y.o. at [redacted]w[redacted]d gestation.  She was counseled that the risk for aneuploidy decreases as gestational age increases, accounting for those pregnancies which spontaneously abort.  We specifically discussed Down syndrome (trisomy 95), trisomies 59 and 73, and sex chromosome aneuploidies (47,XXX and 47,XXY) including the common features and prognoses of each.   We reviewed available screening options including Quad screen, noninvasive prenatal screening (NIPS)/cell free DNA (cfDNA) testing, and detailed ultrasound.  She was counseled that screening tests are used to modify a patient's a priori risk for aneuploidy, typically based on  age. This estimate provides a pregnancy specific risk assessment. We reviewed the benefits and limitations of each option. Specifically, we discussed the conditions for which each test screens, the detection rates, and false positive rates of each. Ms. Teem had Quad screen drawn at her OB yesterday, and results are currently pending. She expressed  interest in pursuing more detailed testing for chromosome conditions. She was also counseled regarding diagnostic testing via CVS and amniocentesis. We reviewed the approximate 1 in 300-500 risk for complications for amniocentesis, including spontaneous pregnancy loss.   After consideration of all the options, [name] expressed interest in pursuing amniocentesis today, if a female fetus is present, for prenatal testing for hemophilia A and chromosome analysis. She decided that if a female fetus is determined to be present, she would elect to pursue NIPS (Panorama) for screening for fetal aneuploidy. Ultrasound today visualized female fetus; thus, [name] elected to pursue NIPS today and declined amniocentesis at this time.  Complete ultrasound results are reported separately. She understands that screening tests cannot rule out all birth defects or genetic syndromes. The patient was advised of this limitation and states she still does not want additional testing at this time.   The family histories were otherwise remarkable for Williams syndrome for the father of the pregnancy's paternal first cousin. The patient had limited information regarding this individual. No additional relatives were reported to have Williams syndrome. Williams syndrome (WS) is a genetic condition that is characterized by cardiovascular disease (supravalvar aortic stenosis, peripheral pulmonary stenosis, elastin arteriopathy, and hypertension), connective tissue abnormalities, mild intellectual disability, distinctive facial features, growth abnormalities, endocrine abnormalities (hypercalcemia, hypercalciuria, hypothyroidism, early puberty), and unique personality characteristics (overfriendliness, empathy, anxiety, specific phobias). The condition is fully penetrant, but the features of WS are variable with no single clinical feature required to establish the clinical diagnosis. WS is caused by the absence of the  Williams-Beuren syndrome critical region [facility]), which encompasses the elastin (ELN) gene, which is caused by a contiguous gene deletion in >99% of individuals with WS. This deletion ranges from 1.55 Mb to 1.84 Mb and thus can be detected by FISH (fluorescent in situ hybridization) studies. WS follows autosomal dominant inheritance. However, most cases are de novo occurrences. In the case that a parent has WS, recurrence risk to offspring would be 1 in 2 (50%). In the case that parents are clinically unaffected with WS, recurrence risk for siblings would be low. Few familial cases have been reported. Thus, given the reported family history and degree of relation, recurrence risk for Williams syndrome in the current pregnancy is not expected to be increased above the general population risk. Without further information regarding the provided family history, an accurate genetic risk cannot be calculated. Further genetic counseling is warranted if more information is obtained.   [name] was provided with written information regarding cystic fibrosis (CF) including the carrier frequency and incidence in the Caucasian population, the availability of carrier testing and prenatal diagnosis if indicated.  In addition, we discussed that CF is routinely screened for as part of the [place] newborn screening panel.  She elected to proceed with CF carrier screening today through [facility].    [name] denied exposure to environmental toxins or chemical agents. She denied the use of tobacco or street drugs. She reported drinking alcohol prior to being aware of the pregnancy. The patient reported that her alcohol exposure was approximate one drink every other night throughout the first 10 weeks of  gestation. She reported no alcohol use since that time. The all-or-none period was discussed, meaning exposures that occur in the first 4 weeks of gestation are typically thought to either not  affect the pregnancy at all or result in a miscarriage.  Prenatal alcohol exposure can increase the risk for growth delays, small head size, heart defects, eye and facial differences, as well as behavior problems and learning disabilities. The risk of these to occur tends to increase with the amount of alcohol consumed. However, because there is no identified safe amount of alcohol in pregnancy, it is recommended to completely avoid alcohol in pregnancy. We discussed the options of detailed ultrasound and fetal echocardiogram in the pregnancy to assess fetal development. The patient expressed interest in both.    She also reported taking adderall and vyvanse before being aware of the pregnancy and has discontinued these medications. We discussed that animal study data have indicated an association with decreased fetal size with prenatal use of amphetamines and methamphetamines. Limited human data regarding amphetamine abuse in pregnancy show adverse effects on intrauterine growth, neonatal behavior, and central nervous system development. However, these associations may not apply to therapeutic use of these agents. Given the reported timing of exposure and amount, risk for adverse effects in the pregnancy for the patient is likely low.   [name] reported that the father of the pregnancy travelled to [place] in September. Neither he nor she reportedly have symptoms suggestive of Zika virus, and he does not have known exposure to the virus. The patient plans to discuss this information with her OB provider as well. She denied significant viral illnesses during the course of her pregnancy. Her medical and surgical histories were noncontributory.   I counseled [name] regarding the above risks and available options.  The approximate face-to-face time with the genetic counselor was 45 minutes.  Quinn PlowmanKaren Kacy Conely, MS,  Certified Genetic Counselor 10/23/2015

## 2015-10-24 ENCOUNTER — Encounter: Payer: Self-pay | Admitting: Family

## 2015-10-24 DIAGNOSIS — O26899 Other specified pregnancy related conditions, unspecified trimester: Secondary | ICD-10-CM | POA: Insufficient documentation

## 2015-10-24 DIAGNOSIS — O280 Abnormal hematological finding on antenatal screening of mother: Secondary | ICD-10-CM | POA: Insufficient documentation

## 2015-10-24 DIAGNOSIS — Z6791 Unspecified blood type, Rh negative: Secondary | ICD-10-CM | POA: Insufficient documentation

## 2015-10-26 ENCOUNTER — Telehealth: Payer: Self-pay | Admitting: Family

## 2015-10-26 ENCOUNTER — Encounter: Payer: Self-pay | Admitting: Family

## 2015-10-26 DIAGNOSIS — Z3402 Encounter for supervision of normal first pregnancy, second trimester: Secondary | ICD-10-CM

## 2015-10-26 DIAGNOSIS — O280 Abnormal hematological finding on antenatal screening of mother: Secondary | ICD-10-CM

## 2015-10-26 DIAGNOSIS — Z3400 Encounter for supervision of normal first pregnancy, unspecified trimester: Secondary | ICD-10-CM | POA: Insufficient documentation

## 2015-10-26 NOTE — Telephone Encounter (Signed)
Called and left message for patient to return call.  Plan to notify regarding +AFP on quad - NIPS pending.  DSR 1:23

## 2015-10-27 ENCOUNTER — Telehealth: Payer: Self-pay | Admitting: Family

## 2015-10-27 DIAGNOSIS — Z3402 Encounter for supervision of normal first pregnancy, second trimester: Secondary | ICD-10-CM

## 2015-10-27 NOTE — Telephone Encounter (Signed)
[name] returned call; informed regarding abnormal quad result and that typically the next step would be to complete the NIPS which was already completed - results pending.

## 2015-10-28 ENCOUNTER — Encounter (HOSPITAL_COMMUNITY): Payer: Self-pay

## 2015-10-28 ENCOUNTER — Encounter: Payer: Self-pay | Admitting: Family Medicine

## 2015-10-28 DIAGNOSIS — O99311 Alcohol use complicating pregnancy, first trimester: Secondary | ICD-10-CM | POA: Insufficient documentation

## 2015-10-28 HISTORY — DX: Alcohol use complicating pregnancy, first trimester: O99.311

## 2015-10-30 ENCOUNTER — Other Ambulatory Visit (HOSPITAL_COMMUNITY): Payer: Self-pay

## 2015-11-05 ENCOUNTER — Other Ambulatory Visit: Payer: Self-pay | Admitting: Family

## 2015-11-05 DIAGNOSIS — Z0489 Encounter for examination and observation for other specified reasons: Secondary | ICD-10-CM

## 2015-11-05 DIAGNOSIS — Z832 Family history of diseases of the blood and blood-forming organs and certain disorders involving the immune mechanism: Secondary | ICD-10-CM

## 2015-11-05 DIAGNOSIS — IMO0002 Reserved for concepts with insufficient information to code with codable children: Secondary | ICD-10-CM

## 2015-11-05 DIAGNOSIS — O09522 Supervision of elderly multigravida, second trimester: Secondary | ICD-10-CM

## 2015-11-09 ENCOUNTER — Telehealth (HOSPITAL_COMMUNITY): Payer: Self-pay | Admitting: MS"

## 2015-11-09 NOTE — Telephone Encounter (Signed)
Patient called to inquire about prenatal cell free DNA testing results given that her Quad screen recently resulted as increased Down syndrome risk. [name] had Panorama testing through [facility].  Testing was offered because of maternal age.   The patient was identified by name and DOB.  We reviewed that these are within normal limits, showing a less than 1 in 10,000 risk for trisomies 21, 18 and 13, and monosomy X (Turner syndrome).  In addition, the risk for triploidy/vanishing twin and sex chromosome trisomies (47,XXX and 47,XXY) was also low risk. We reviewed that this testing identifies > 99% of pregnancies with trisomy 13, trisomy 62, sex chromosome trisomies (47,XXX and 47,XXY), and triploidy. The detection rate for trisomy 18 is 96%.  The detection rate for monosomy X is ~92%.  The false positive rate is <0.1% for all conditions. Testing was also consistent with female fetal sex.  The patient did wish to know fetal sex.  She understands that this testing does not identify all genetic conditions.  All questions were answered to her satisfaction, she was encouraged to call with additional questions or concerns.  Dominique Plowman, MS Patent attorney

## 2015-11-09 NOTE — Telephone Encounter (Signed)
Attempted to call patient regarding normal CF carrier screen. Left message for patient to return call.   Dominique Powers 11/09/2015 12:17 PM

## 2015-11-10 ENCOUNTER — Other Ambulatory Visit (HOSPITAL_COMMUNITY): Payer: Self-pay | Admitting: MS"

## 2015-11-10 ENCOUNTER — Other Ambulatory Visit (HOSPITAL_COMMUNITY): Payer: Self-pay

## 2015-11-11 ENCOUNTER — Ambulatory Visit (HOSPITAL_COMMUNITY): Payer: Self-pay

## 2015-11-11 ENCOUNTER — Ambulatory Visit (HOSPITAL_COMMUNITY)
Admission: RE | Admit: 2015-11-11 | Discharge: 2015-11-11 | Disposition: A | Discharge status: Home / Self Care | Payer: Managed Care, Other (non HMO) | Source: Ambulatory Visit | Attending: Family | Admitting: Family

## 2015-11-11 ENCOUNTER — Other Ambulatory Visit: Payer: Self-pay | Admitting: Family

## 2015-11-11 VITALS — BP 131/87 | HR 108 | Wt 151.2 lb

## 2015-11-11 DIAGNOSIS — Z3A18 18 weeks gestation of pregnancy: Secondary | ICD-10-CM

## 2015-11-11 DIAGNOSIS — Z3A19 19 weeks gestation of pregnancy: Secondary | ICD-10-CM

## 2015-11-11 DIAGNOSIS — O09522 Supervision of elderly multigravida, second trimester: Secondary | ICD-10-CM | POA: Insufficient documentation

## 2015-11-11 DIAGNOSIS — IMO0002 Reserved for concepts with insufficient information to code with codable children: Secondary | ICD-10-CM

## 2015-11-11 DIAGNOSIS — Z0489 Encounter for examination and observation for other specified reasons: Secondary | ICD-10-CM

## 2015-11-11 DIAGNOSIS — Z8489 Family history of other specified conditions: Secondary | ICD-10-CM | POA: Insufficient documentation

## 2015-11-11 DIAGNOSIS — Z36 Encounter for antenatal screening of mother: Secondary | ICD-10-CM | POA: Diagnosis not present

## 2015-11-11 DIAGNOSIS — Z832 Family history of diseases of the blood and blood-forming organs and certain disorders involving the immune mechanism: Secondary | ICD-10-CM

## 2015-11-11 DIAGNOSIS — Z3402 Encounter for supervision of normal first pregnancy, second trimester: Secondary | ICD-10-CM

## 2015-11-18 ENCOUNTER — Ambulatory Visit (INDEPENDENT_AMBULATORY_CARE_PROVIDER_SITE_OTHER): Payer: Managed Care, Other (non HMO) | Admitting: Advanced Practice Midwife

## 2015-11-18 VITALS — BP 133/78 | HR 98 | Temp 98.1°F | Wt 151.5 lb

## 2015-11-18 DIAGNOSIS — R51 Headache: Secondary | ICD-10-CM

## 2015-11-18 DIAGNOSIS — O289 Unspecified abnormal findings on antenatal screening of mother: Secondary | ICD-10-CM

## 2015-11-18 DIAGNOSIS — R519 Headache, unspecified: Secondary | ICD-10-CM

## 2015-11-18 DIAGNOSIS — R102 Pelvic and perineal pain: Secondary | ICD-10-CM

## 2015-11-18 DIAGNOSIS — O26899 Other specified pregnancy related conditions, unspecified trimester: Secondary | ICD-10-CM

## 2015-11-18 DIAGNOSIS — O99312 Alcohol use complicating pregnancy, second trimester: Secondary | ICD-10-CM

## 2015-11-18 DIAGNOSIS — O99311 Alcohol use complicating pregnancy, first trimester: Secondary | ICD-10-CM

## 2015-11-18 DIAGNOSIS — N949 Unspecified condition associated with female genital organs and menstrual cycle: Secondary | ICD-10-CM

## 2015-11-18 DIAGNOSIS — O26892 Other specified pregnancy related conditions, second trimester: Secondary | ICD-10-CM

## 2015-11-18 DIAGNOSIS — O280 Abnormal hematological finding on antenatal screening of mother: Secondary | ICD-10-CM

## 2015-11-18 DIAGNOSIS — O9989 Other specified diseases and conditions complicating pregnancy, childbirth and the puerperium: Secondary | ICD-10-CM

## 2015-11-18 LAB — POCT URINALYSIS DIP (DEVICE)
Bilirubin Urine: NEGATIVE
GLUCOSE, UA: NEGATIVE mg/dL
HGB URINE DIPSTICK: NEGATIVE
KETONES UR: NEGATIVE mg/dL
LEUKOCYTES UA: NEGATIVE
Nitrite: NEGATIVE
Protein, ur: NEGATIVE mg/dL
SPECIFIC GRAVITY, URINE: 1.02 (ref 1.005–1.030)
UROBILINOGEN UA: 0.2 mg/dL (ref 0.0–1.0)
pH: 7 (ref 5.0–8.0)

## 2015-11-18 NOTE — Progress Notes (Signed)
Pt reports frequent headaches - mostly relieved with Tylenol. [place] for anatomy done 1/25.  Pt has questions about fetal echo - scheduled on 2/9.

## 2015-11-18 NOTE — Progress Notes (Signed)
Subjective:  Kaizley Aja is a 35 y.o. G3P0020 at [redacted]w[redacted]d being seen today for ongoing prenatal care.  She is currently monitored for the following issues for this low-risk pregnancy and has 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; Advanced maternal age in multigravida; Abnormal quad screen; Rh negative status during pregnancy, antepartum; Supervision of normal first pregnancy; and Alcohol consumption during pregnancy in first trimester on her problem list.  Patient reports headaches daily that are mild and resolve without treatment usually.  She also reports occasional sharp inguinal pains with movement..  Contractions: Not present. Vag. Bleeding: None.  Movement: Present. Denies leaking of fluid.   The following portions of the patient's history were reviewed and updated as appropriate: allergies, current medications, past family history, past medical history, past social history, past surgical history and problem list. Problem list updated.  Objective:   Filed Vitals:   11/18/15 0959  BP: 133/78  Pulse: 98  Temp: 98.1 F (36.7 C)  Weight: 151 lb 8 oz (68.72 kg)    Fetal Status: Fetal Heart Rate (bpm): 152 Fundal Height: 20 cm Movement: Present     General:  Alert, oriented and cooperative. Patient is in no acute distress.  Skin: Skin is warm and dry. No rash noted.   Cardiovascular: Normal heart rate noted  Respiratory: Normal respiratory effort, no problems with respiration noted  Abdomen: Soft, gravid, appropriate for gestational age. Pain/Pressure: Present     Pelvic: Vag. Bleeding: None     Cervical exam deferred        Extremities: Normal range of motion.  Edema: None  Mental Status: Normal mood and affect. Normal behavior. Normal judgment and thought content.   Urinalysis: Urine Protein: Negative Urine Glucose: Negative  Assessment and Plan:  Pregnancy: G3P0020 at [redacted]w[redacted]d  1. Headache in pregnancy, antepartum, second trimester --Dull frontal h/a, associated with some sinus pressure, recent  URI. No light sensitivity or n/v with h/a.   --Increase water intake, discussed safe OTC medications for congestion.  Pt declines Fioricet Rx at this time, will use Tylenol PRN.  2. Abnormal quad screen --Normal NIPS, reviewed today with pt.  3. Alcohol consumption during pregnancy in first trimester --Pt did not know about pregnancy until >10 weeks.   --Normal anatomy scan.  MFM recommended fetal ECHO, scheduled 2/9.  4.  Round ligament pain of pregnancy --Rest/ice/heat/Tylenol, good ergonomics to resolve pain  Preterm labor symptoms and general obstetric precautions including but not limited to vaginal bleeding, contractions, leaking of fluid and fetal movement were reviewed in detail with the patient. Please refer to After Visit Summary for other counseling recommendations.  Return in about 4 weeks (around 12/16/2015).   Hurshel Party, CNM

## 2015-12-16 ENCOUNTER — Telehealth: Payer: Self-pay | Admitting: Certified Nurse Midwife

## 2015-12-16 ENCOUNTER — Encounter: Payer: Managed Care, Other (non HMO) | Admitting: Certified Nurse Midwife

## 2015-12-16 NOTE — Telephone Encounter (Signed)
Called patient about missed appointment, and that we have rescheduled her appointment.

## 2015-12-22 ENCOUNTER — Encounter: Payer: Managed Care, Other (non HMO) | Admitting: Certified Nurse Midwife

## 2016-01-05 ENCOUNTER — Encounter (HOSPITAL_COMMUNITY): Payer: Self-pay

## 2016-01-27 ENCOUNTER — Encounter: Payer: Managed Care, Other (non HMO) | Attending: Obstetrics & Gynecology

## 2016-01-27 VITALS — Ht 62.0 in | Wt 157.5 lb

## 2016-01-27 DIAGNOSIS — O99810 Abnormal glucose complicating pregnancy: Secondary | ICD-10-CM | POA: Insufficient documentation

## 2016-01-27 DIAGNOSIS — Z3A00 Weeks of gestation of pregnancy not specified: Secondary | ICD-10-CM | POA: Diagnosis not present

## 2016-01-27 DIAGNOSIS — O24419 Gestational diabetes mellitus in pregnancy, unspecified control: Secondary | ICD-10-CM

## 2016-01-27 NOTE — Progress Notes (Signed)
  Patient was seen on 01/27/16 for Gestational Diabetes self-management . The following learning objectives were met by the patient :   States the definition of Gestational Diabetes  States why dietary management is important in controlling blood glucose  Describes the effects of carbohydrates on blood glucose levels  Demonstrates ability to create a balanced meal plan  Demonstrates carbohydrate counting   States when to check blood glucose levels  Demonstrates proper blood glucose monitoring techniques  States the effect of stress and exercise on blood glucose levels  States the importance of limiting caffeine and abstaining from alcohol and smoking  Plan:  Aim for 2 Carb Choices per meal (30 grams) +/- 1 either way for breakfast Aim for 3 Carb Choices per meal (45 grams) +/- 1 either way from lunch and dinner Aim for 1-2 Carbs per snack Begin reading food labels for Total Carbohydrate and sugar grams of foods Consider  increasing your activity level by walking daily as tolerated Begin checking BG before breakfast and 2 hours after first bit of breakfast, lunch and dinner after  as directed by MD  Take medication  as directed by MD  Blood glucose monitor given: One Touch Verio Flex  Lot # [id number] X Exp: 05/16/17 Blood glucose reading: 153m/dl (banana)  Patient instructed to monitor glucose levels: FBS: 60 - <90 2 hour: <120  Patient received the following handouts:  Nutrition Diabetes and Pregnancy  Carbohydrate Counting List  Meal Planning worksheet  Patient will be seen for follow-up as needed.

## 2016-02-02 ENCOUNTER — Encounter: Payer: Self-pay | Admitting: Obstetrics & Gynecology

## 2016-03-09 LAB — OB RESULTS CONSOLE GBS: GBS: NEGATIVE

## 2016-03-30 ENCOUNTER — Telehealth (HOSPITAL_COMMUNITY): Payer: Self-pay | Admitting: *Deleted

## 2016-03-30 ENCOUNTER — Encounter (HOSPITAL_COMMUNITY): Payer: Self-pay | Admitting: *Deleted

## 2016-03-30 NOTE — Telephone Encounter (Signed)
Preadmission screen  

## 2016-04-04 ENCOUNTER — Inpatient Hospital Stay (HOSPITAL_COMMUNITY)
Admission: AD | Admit: 2016-04-04 | Payer: Managed Care, Other (non HMO) | Source: Ambulatory Visit | Admitting: Obstetrics & Gynecology

## 2016-04-04 ENCOUNTER — Other Ambulatory Visit: Payer: Self-pay | Admitting: Obstetrics & Gynecology

## 2016-04-06 ENCOUNTER — Inpatient Hospital Stay (HOSPITAL_COMMUNITY): Payer: Managed Care, Other (non HMO) | Admitting: Anesthesiology

## 2016-04-06 ENCOUNTER — Inpatient Hospital Stay (HOSPITAL_COMMUNITY)
Admission: RE | Admit: 2016-04-06 | Discharge: 2016-04-09 | DRG: 766 | Disposition: A | Discharge status: Home / Self Care | Payer: Managed Care, Other (non HMO) | Source: Ambulatory Visit | Attending: Obstetrics & Gynecology | Admitting: Obstetrics & Gynecology

## 2016-04-06 ENCOUNTER — Encounter (HOSPITAL_COMMUNITY): Payer: Self-pay

## 2016-04-06 VITALS — BP 128/77 | HR 77 | Temp 98.1°F | Resp 18 | Ht 61.0 in | Wt 157.0 lb

## 2016-04-06 DIAGNOSIS — D649 Anemia, unspecified: Secondary | ICD-10-CM | POA: Diagnosis present

## 2016-04-06 DIAGNOSIS — O26893 Other specified pregnancy related conditions, third trimester: Secondary | ICD-10-CM | POA: Diagnosis present

## 2016-04-06 DIAGNOSIS — Z6791 Unspecified blood type, Rh negative: Secondary | ICD-10-CM

## 2016-04-06 DIAGNOSIS — Z3A40 40 weeks gestation of pregnancy: Secondary | ICD-10-CM | POA: Diagnosis not present

## 2016-04-06 DIAGNOSIS — Z1401 Asymptomatic hemophilia A carrier: Secondary | ICD-10-CM

## 2016-04-06 DIAGNOSIS — O9902 Anemia complicating childbirth: Secondary | ICD-10-CM | POA: Diagnosis present

## 2016-04-06 DIAGNOSIS — O09529 Supervision of elderly multigravida, unspecified trimester: Secondary | ICD-10-CM

## 2016-04-06 DIAGNOSIS — Z833 Family history of diabetes mellitus: Secondary | ICD-10-CM

## 2016-04-06 DIAGNOSIS — O24420 Gestational diabetes mellitus in childbirth, diet controlled: Principal | ICD-10-CM | POA: Diagnosis present

## 2016-04-06 DIAGNOSIS — Z3402 Encounter for supervision of normal first pregnancy, second trimester: Secondary | ICD-10-CM

## 2016-04-06 DIAGNOSIS — O280 Abnormal hematological finding on antenatal screening of mother: Secondary | ICD-10-CM

## 2016-04-06 DIAGNOSIS — O99311 Alcohol use complicating pregnancy, first trimester: Secondary | ICD-10-CM

## 2016-04-06 DIAGNOSIS — Z3490 Encounter for supervision of normal pregnancy, unspecified, unspecified trimester: Secondary | ICD-10-CM

## 2016-04-06 DIAGNOSIS — O24410 Gestational diabetes mellitus in pregnancy, diet controlled: Secondary | ICD-10-CM

## 2016-04-06 HISTORY — DX: Gestational diabetes mellitus in pregnancy, diet controlled: O24.410

## 2016-04-06 LAB — CBC
HEMATOCRIT: 38.9 % (ref 36.0–46.0)
HEMOGLOBIN: 13.4 g/dL (ref 12.0–15.0)
MCH: 27.5 pg (ref 26.0–34.0)
MCHC: 34.4 g/dL (ref 30.0–36.0)
MCV: 79.9 fL (ref 78.0–100.0)
Platelets: 150 10*3/uL (ref 150–400)
RBC: 4.87 MIL/uL (ref 3.87–5.11)
RDW: 15.1 % (ref 11.5–15.5)
WBC: 8 10*3/uL (ref 4.0–10.5)

## 2016-04-06 LAB — RPR: RPR Ser Ql: NONREACTIVE

## 2016-04-06 LAB — GLUCOSE, CAPILLARY: Glucose-Capillary: 101 mg/dL — ABNORMAL HIGH (ref 65–99)

## 2016-04-06 MED ORDER — OXYTOCIN 40 UNITS IN LACTATED RINGERS INFUSION - SIMPLE MED
2.5000 [IU]/h | INTRAVENOUS | Status: DC
  Filled 2016-04-06: qty 1000

## 2016-04-06 MED ORDER — OXYTOCIN BOLUS FROM INFUSION: 500.0000 mL | INTRAVENOUS | Status: DC

## 2016-04-06 MED ORDER — OXYCODONE-ACETAMINOPHEN 5-325 MG PO TABS: 1.0000 | ORAL_TABLET | ORAL | Status: DC | PRN

## 2016-04-06 MED ORDER — LIDOCAINE HCL (PF) 1 % IJ SOLN: 30.0000 mL | INTRAMUSCULAR | Status: DC | PRN

## 2016-04-06 MED ORDER — OXYCODONE-ACETAMINOPHEN 5-325 MG PO TABS: 2.0000 | ORAL_TABLET | ORAL | Status: DC | PRN

## 2016-04-06 MED ORDER — ONDANSETRON HCL 4 MG/2ML IJ SOLN: 4.0000 mg | Freq: Four times a day (QID) | INTRAMUSCULAR | Status: DC | PRN

## 2016-04-06 MED ORDER — TERBUTALINE SULFATE 1 MG/ML IJ SOLN: 0.2500 mg | Freq: Once | INTRAMUSCULAR | Status: DC | PRN

## 2016-04-06 MED ORDER — EPHEDRINE 5 MG/ML INJ: 10.0000 mg | INTRAVENOUS | Status: DC | PRN

## 2016-04-06 MED ORDER — BUTORPHANOL TARTRATE 1 MG/ML IJ SOLN
1.0000 mg | INTRAMUSCULAR | Status: DC | PRN
  Administered 2016-04-06: 1 mg via INTRAVENOUS

## 2016-04-06 MED ORDER — PHENYLEPHRINE 40 MCG/ML (10ML) SYRINGE FOR IV PUSH (FOR BLOOD PRESSURE SUPPORT): 80.0000 ug | PREFILLED_SYRINGE | INTRAVENOUS | Status: DC | PRN

## 2016-04-06 MED ORDER — SOD CITRATE-CITRIC ACID 500-334 MG/5ML PO SOLN
30.0000 mL | ORAL | Status: DC | PRN
  Administered 2016-04-07: 30 mL via ORAL
  Filled 2016-04-06: qty 15

## 2016-04-06 MED ORDER — ACETAMINOPHEN 325 MG PO TABS
650.0000 mg | ORAL_TABLET | ORAL | Status: DC | PRN
  Administered 2016-04-06: 650 mg via ORAL
  Filled 2016-04-06: qty 2

## 2016-04-06 MED ORDER — LACTATED RINGERS IV SOLN
INTRAVENOUS | Status: DC
  Administered 2016-04-06: 01:00:00 via INTRAVENOUS
  Administered 2016-04-06: 125 mL/h via INTRAVENOUS
  Administered 2016-04-07: 04:00:00 via INTRAVENOUS

## 2016-04-06 MED ORDER — FENTANYL 2.5 MCG/ML BUPIVACAINE 1/10 % EPIDURAL INFUSION (WH - ANES)
14.0000 mL/h | INTRAMUSCULAR | Status: DC | PRN
  Administered 2016-04-06: 11.5 mL/h via EPIDURAL
  Administered 2016-04-07 (×2): 14 mL/h via EPIDURAL
  Filled 2016-04-06 (×3): qty 125

## 2016-04-06 MED ORDER — LACTATED RINGERS IV SOLN
500.0000 mL | Freq: Once | INTRAVENOUS | Status: AC
  Administered 2016-04-06: 500 mL via INTRAVENOUS

## 2016-04-06 MED ORDER — PHENYLEPHRINE 40 MCG/ML (10ML) SYRINGE FOR IV PUSH (FOR BLOOD PRESSURE SUPPORT)
80.0000 ug | PREFILLED_SYRINGE | INTRAVENOUS | Status: DC | PRN
  Filled 2016-04-06: qty 10

## 2016-04-06 MED ORDER — FLEET ENEMA 7-19 GM/118ML RE ENEM: 1.0000 | ENEMA | RECTAL | Status: DC | PRN

## 2016-04-06 MED ORDER — LACTATED RINGERS IV SOLN
500.0000 mL | INTRAVENOUS | Status: DC | PRN
  Administered 2016-04-06 – 2016-04-07 (×3): 500 mL via INTRAVENOUS

## 2016-04-06 MED ORDER — MISOPROSTOL 25 MCG QUARTER TABLET
25.0000 ug | ORAL_TABLET | ORAL | Status: DC | PRN
  Administered 2016-04-06 (×2): 25 ug via VAGINAL
  Filled 2016-04-06 (×2): qty 0.25

## 2016-04-06 MED ORDER — OXYTOCIN 40 UNITS IN LACTATED RINGERS INFUSION - SIMPLE MED
1.0000 m[IU]/min | INTRAVENOUS | Status: DC
  Administered 2016-04-06: 2 m[IU]/min via INTRAVENOUS

## 2016-04-06 MED ORDER — DIPHENHYDRAMINE HCL 50 MG/ML IJ SOLN: 12.5000 mg | INTRAMUSCULAR | Status: DC | PRN

## 2016-04-06 MED ORDER — LIDOCAINE HCL (PF) 1 % IJ SOLN
INTRAMUSCULAR | Status: DC | PRN
  Administered 2016-04-06: 4 mL via EPIDURAL
  Administered 2016-04-06: 3 mL via EPIDURAL

## 2016-04-06 MED ORDER — BUTORPHANOL TARTRATE 1 MG/ML IJ SOLN
INTRAMUSCULAR | Status: AC
  Administered 2016-04-06: 1 mg via INTRAVENOUS
  Filled 2016-04-06: qty 1

## 2016-04-06 NOTE — Anesthesia Preprocedure Evaluation (Signed)
Anesthesia Evaluation  Patient identified by MRN, date of birth, ID band Patient awake    Reviewed: Allergy & Precautions, H&P , Patient's Chart, lab work & pertinent test results  Airway Mallampati: II  TM Distance: >3 FB Neck ROM: full    Dental no notable dental hx. (+) Teeth Intact   Pulmonary neg pulmonary ROS,    Pulmonary exam normal breath sounds clear to auscultation       Cardiovascular negative cardio ROS Normal cardiovascular exam Rhythm:regular Rate:Normal     Neuro/Psych negative neurological ROS  negative psych ROS   GI/Hepatic negative GI ROS, Neg liver ROS,   Endo/Other  negative endocrine ROS  Renal/GU negative Renal ROS  negative genitourinary   Musculoskeletal   Abdominal   Peds  Hematology negative hematology ROS (+)   Anesthesia Other Findings   Reproductive/Obstetrics (+) Pregnancy                             Anesthesia Physical Anesthesia Plan  ASA: II  Anesthesia Plan: Epidural   Post-op Pain Management:    Induction:   Airway Management Planned:   Additional Equipment:   Intra-op Plan:   Post-operative Plan:   Informed Consent: I have reviewed the patients History and Physical, chart, labs and discussed the procedure including the risks, benefits and alternatives for the proposed anesthesia with the patient or authorized representative who has indicated his/her understanding and acceptance.     Plan Discussed with: Anesthesiologist  Anesthesia Plan Comments:         Anesthesia Quick Evaluation  

## 2016-04-06 NOTE — H&P (Signed)
Roger Okey DupreCrawford is a 35 y.o. female presenting for labor IOL for GDM A1. EDC 04/04/16 LMP c/w 12 wk sono.  [id number].  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  Rh negative, s/p Rhogam Anemia  History OB History    Gravida Para Term Preterm AB TAB SAB Ectopic Multiple Living   3    2 2          Past Medical History  Diagnosis Date  . ADD (attention deficit disorder)   . Gestational diabetes mellitus (GDM), antepartum   . 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 Mole removal    . Wisdom tooth extraction     Family History: family history includes Diabetes in her maternal grandmother; Hemophilia in her cousin and cousin; Hyperlipidemia in her mother; Other in her mother. Social History:  reports that she has never smoked. She has never used smokeless tobacco. She reports that she does not drink alcohol or use illicit drugs.   Prenatal Transfer Tool  Maternal Diabetes: Yes:  Diabetes Type:  Diet controlled Genetic Screening: Abnormal:  Results: Other:Abn QUAD but Panorama normal Diploid, XX. AFP1 normal. 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 rate 18, height 5\' 1"  (1.549 m), weight 157 lb (71.215 kg), last  menstrual period 06/29/2015. Exam Physical Exam  Physical exam:  A&O x 3, no acute distress. Pleasant HEENT neg, no thyromegaly Lungs CTA bilat CV RRR, S1S2 normal Abdo soft, non tender, non acute Extr no edema/ tenderness Pelvic above FHT  Reactive, category I Toco none at admission  Prenatal labs: ABO, Rh: --/--/B NEG (06/21 0105) Antibody: POS (06/21 0105) Rubella: 2.27 (01/04 0846) RPR: NON REAC (01/04 0846)  HBsAg: NEGATIVE (01/04 0846)  HIV: NONREACTIVE (01/04 0846)  GBS: Negative (05/24 0000)  Failed 3hr Glucola 1st trim Panorama Diploid and XX  Assessment/Plan: 35 yo G3P0 at 40 wks, IOL for A1GDM. Hemophilia carrier but this is Female baby. Previa resolved. Rh negative. Anemia.  Cytotec x 2 doses, then pitocin AM, pain meds reviewed, epidural in active labor.  BS ev ery 4-6 hrs  Laneice Meneely R 04/06/2016, 7:02 AM

## 2016-04-06 NOTE — Progress Notes (Signed)
Subjective No pain/ pressure.   Objective: BP 129/73 mmHg  Pulse 97  Temp(Src) 98 F (36.7 C) (Oral)  Resp 16  Ht 5\' 1"  (1.549 m)  Wt 157 lb (71.215 kg)  BMI 29.68 kg/m2  SpO2 97%  LMP 06/29/2015 (LMP Unknown)   FHT:  FHR: 140 (change in baseline) recurrent decels, pitocin was at 2 only and tolerated for 30 min after restart and had another prolonged 3 min decels, per RN with position change, bpm, variability: minimal ,  accelerations:  Abscent,  decelerations:  Present intermittent prolonged decel but since recovering with min to mod variability, plan to observe for progress  UC:   irregular, every 3-5 minutes SVE:   Dilation: 4 Effacement (%): 100 Station: -2 Exam by:: Dr Juliene PinaMody Unchanged, high station, concern for inlet CPD   Assessment / Plan: Protracted active phase possible arrest. High station, possible Inlet CPD as well as fetal intolerance to labor. Hold pitocin again, restart in 30 min if FHT back to category I. Pt counseled, possible C/s need if no progress or FHT not tolerating labor.   =  Dominique Powers 04/06/2016, 8:14 PM

## 2016-04-06 NOTE — Progress Notes (Signed)
Dominique Powers is a 35 y.o. G3P0020 at 428w2d by LMP admitted for induction of labor due to Gestational diabetes.  Subjective: UCs strong. Pain options reviewed, recc Stadol now and Nitrous Oxide in active labor   Objective: BP 119/81 mmHg  Pulse 83  Temp(Src) 98.3 F (36.8 C) (Oral)  Resp 16  Ht 5\' 1"  (1.549 m)  Wt 157 lb (71.215 kg)  BMI 29.68 kg/m2  LMP 06/29/2015 (LMP Unknown)    FHT:  120s./ category I UC:   regular, every 3-4 minutes SVE:   Dilation: 1 Effacement (%): 70 Station: -3 Exam by:: Dominique HallJenny Middleton Powers Per Powers, SROM, clear fluid   Labs:  Lab Results  Component Value Date   WBC 8.0 04/06/2016   HGB 13.4 04/06/2016   HCT 38.9 04/06/2016   MCV 79.9 04/06/2016   PLT 150 04/06/2016    Assessment / Plan: Induction of labor due to gestational diabetes,  progressing well on pitocin  Labor: Progressing on Pitocin, will continue to increase then AROM, slow progress in latent labor Fetal Wellbeing:  Category I Pain Control:  options revier=d  I/D:  PCN per protocol Anticipated MOD:  Guarded, EFW 7 lb-7/1/2 lbs  Dominique Powers 04/06/2016, 3:51 PM

## 2016-04-06 NOTE — Anesthesia Procedure Notes (Signed)
Epidural Patient location during procedure: OB Start time: 04/06/2016 4:07 PM  Staffing Anesthesiologist: Mal AmabileFOSTER, Amer Alcindor Performed by: anesthesiologist   Preanesthetic Checklist Completed: patient identified, site marked, surgical consent, pre-op evaluation, timeout performed, IV checked, risks and benefits discussed and monitors and equipment checked  Epidural Patient position: sitting Prep: site prepped and draped and DuraPrep Patient monitoring: continuous pulse ox and blood pressure Approach: midline Location: L3-L4 Injection technique: LOR air  Needle:  Needle type: Tuohy  Needle gauge: 17 G Needle length: 9 cm and 9 Needle insertion depth: 4 cm Catheter type: closed end flexible Catheter size: 19 Gauge Catheter at skin depth: 9 cm Test dose: negative and Other  Assessment Events: blood not aspirated, injection not painful, no injection resistance, negative IV test and no paresthesia  Additional Notes Patient identified. Risks and benefits discussed including failed block, incomplete  Pain control, post dural puncture headache, nerve damage, paralysis, blood pressure Changes, nausea, vomiting, reactions to medications-both toxic and allergic and post Partum back pain. All questions were answered. Patient expressed understanding and wished to proceed. Sterile technique was used throughout procedure. Epidural site was Dressed with sterile barrier dressing. No paresthesias, signs of intravascular injection Or signs of intrathecal spread were encountered.  Patient was more comfortable after the epidural was dosed. Please see RN's note for documentation of vital signs and FHR which are stable.

## 2016-04-06 NOTE — Progress Notes (Signed)
Wilmer FloorChelsi Frisbee is a 35 y.o. G3P0020 at 6237w2d by LMP c/w 1st trim sono, admitted for induction of labor due to A1GDM. EFW 7.1/2 lbs.  Subjective: No complaints now. But concerned due to recent fetal decel for 6 minutes, now recovered.   Objective: BP 140/83 mmHg  Pulse 79  Temp(Src) 98.5 F (36.9 C) (Oral)  Resp 16  Ht 5\' 1"  (1.549 m)  Wt 157 lb (71.215 kg)  BMI 29.68 kg/m2  LMP 06/29/2015 (LMP Unknown)   FHT:  FHR: 125-130 bpm, variability: moderate,  accelerations:  Present,  decelerations:  Present 6 min decel from 120 to 80-90 for overall 6 minutes. recovered now. S/p one inj Terbutaline though she was not havign tachysystole UC:   irregular, every 4-5 minutes since pitocin turned off  SVE:   Dilation: 4.5 Effacement (%): 100 Station: -2 Exam by:: Davina PokeErin Foley, RN Deferring recheck now.   Assessment / Plan: Induction of labor due to gestational diabetes,  progressing well on pitocin but now off pitocin since fetal intolerance to labor. High station. Borderline Pelvis. Reassess progress, recheck 1 hr. Will need to start back pitocin if UCs dont pick up again.  MOD: Guarded.  Haliey Romberg R 04/06/2016, 6:53 PM

## 2016-04-06 NOTE — Anesthesia Pain Management Evaluation Note (Signed)
  CRNA Pain Management Visit Note  Patient: Dominique Powers, 35 y.o., female  "Hello I am a member of the anesthesia team at [facility]. We have an anesthesia team available at all times to provide care throughout the hospital, including epidural management and anesthesia for C-section. I don't know your plan for the delivery whether it a natural birth, water birth, IV sedation, nitrous supplementation, doula or epidural, but we want to meet your pain goals."   1.Was your pain managed to your expectations on prior hospitalizations?   Yes   2.What is your expectation for pain management during this hospitalization?     Nitrous Oxide  3.How can we help you reach that goal? nitrous  Record the patient's initial score and the patient's pain goal.   Pain: 2  Pain Goal: 8 [facility] wants you to be able to say your pain was always managed very well.  Madison HickmanGREGORY,Jenne Sellinger 04/06/2016

## 2016-04-07 ENCOUNTER — Encounter (HOSPITAL_COMMUNITY)
Admission: RE | Disposition: A | Discharge status: Home / Self Care | Payer: Self-pay | Source: Ambulatory Visit | Attending: Obstetrics & Gynecology

## 2016-04-07 ENCOUNTER — Encounter (HOSPITAL_COMMUNITY): Payer: Self-pay

## 2016-04-07 LAB — GLUCOSE, CAPILLARY: Glucose-Capillary: 106 mg/dL — ABNORMAL HIGH (ref 65–99)

## 2016-04-07 SURGERY — Surgical Case
Anesthesia: Epidural

## 2016-04-07 MED ORDER — TETANUS-DIPHTH-ACELL PERTUSSIS 5-2.5-18.5 LF-MCG/0.5 IM SUSP
0.5000 mL | Freq: Once | INTRAMUSCULAR | Status: DC
Start: 2016-04-08 — End: 2016-04-09

## 2016-04-07 MED ORDER — CARBOPROST TROMETHAMINE 250 MCG/ML IM SOLN
INTRAMUSCULAR | Status: DC | PRN
  Administered 2016-04-07: 250 ug via INTRAMUSCULAR

## 2016-04-07 MED ORDER — GENTAMICIN SULFATE 40 MG/ML IJ SOLN
INTRAVENOUS | Status: AC
  Administered 2016-04-07: 115 mL via INTRAVENOUS
  Filled 2016-04-07: qty 9

## 2016-04-07 MED ORDER — ZOLPIDEM TARTRATE 5 MG PO TABS: 5.0000 mg | ORAL_TABLET | Freq: Every evening | ORAL | Status: DC | PRN

## 2016-04-07 MED ORDER — COCONUT OIL OIL
1.0000 "application " | TOPICAL_OIL | Status: DC | PRN
  Administered 2016-04-08: 1 via TOPICAL
  Filled 2016-04-07: qty 120

## 2016-04-07 MED ORDER — NALBUPHINE HCL 10 MG/ML IJ SOLN: 5.0000 mg | Freq: Once | INTRAMUSCULAR | Status: DC | PRN

## 2016-04-07 MED ORDER — KETOROLAC TROMETHAMINE 30 MG/ML IJ SOLN: 30.0000 mg | Freq: Four times a day (QID) | INTRAMUSCULAR | Status: AC | PRN

## 2016-04-07 MED ORDER — IBUPROFEN 600 MG PO TABS
600.0000 mg | ORAL_TABLET | Freq: Four times a day (QID) | ORAL | Status: DC
  Administered 2016-04-07 – 2016-04-09 (×7): 600 mg via ORAL
  Filled 2016-04-07 (×7): qty 1

## 2016-04-07 MED ORDER — LIDOCAINE-EPINEPHRINE (PF) 2 %-1:200000 IJ SOLN
INTRAMUSCULAR | Status: AC
  Filled 2016-04-07: qty 20

## 2016-04-07 MED ORDER — SCOPOLAMINE 1 MG/3DAYS TD PT72
MEDICATED_PATCH | TRANSDERMAL | Status: AC
  Filled 2016-04-07: qty 1

## 2016-04-07 MED ORDER — OXYTOCIN 10 UNIT/ML IJ SOLN
40.0000 [IU] | INTRAMUSCULAR | Status: DC | PRN
  Administered 2016-04-07: 40 [IU] via INTRAVENOUS

## 2016-04-07 MED ORDER — SCOPOLAMINE 1 MG/3DAYS TD PT72
MEDICATED_PATCH | TRANSDERMAL | Status: DC | PRN
  Administered 2016-04-07: 1 via TRANSDERMAL

## 2016-04-07 MED ORDER — MORPHINE SULFATE (PF) 0.5 MG/ML IJ SOLN
INTRAMUSCULAR | Status: DC | PRN
Start: 2016-04-07 — End: 2016-04-07
  Administered 2016-04-07: 3 mg via EPIDURAL

## 2016-04-07 MED ORDER — OXYTOCIN 40 UNITS IN LACTATED RINGERS INFUSION - SIMPLE MED: 2.5000 [IU]/h | INTRAVENOUS | Status: AC

## 2016-04-07 MED ORDER — OXYCODONE-ACETAMINOPHEN 5-325 MG PO TABS: 2.0000 | ORAL_TABLET | ORAL | Status: DC | PRN

## 2016-04-07 MED ORDER — DEXAMETHASONE SODIUM PHOSPHATE 4 MG/ML IJ SOLN
INTRAMUSCULAR | Status: DC | PRN
  Administered 2016-04-07: 4 mg via INTRAVENOUS

## 2016-04-07 MED ORDER — DEXAMETHASONE SODIUM PHOSPHATE 4 MG/ML IJ SOLN
INTRAMUSCULAR | Status: AC
  Filled 2016-04-07: qty 1

## 2016-04-07 MED ORDER — OXYTOCIN 10 UNIT/ML IJ SOLN
INTRAMUSCULAR | Status: AC
  Filled 2016-04-07: qty 4

## 2016-04-07 MED ORDER — SCOPOLAMINE 1 MG/3DAYS TD PT72: 1.0000 | MEDICATED_PATCH | Freq: Once | TRANSDERMAL | Status: DC

## 2016-04-07 MED ORDER — PHENYLEPHRINE 40 MCG/ML (10ML) SYRINGE FOR IV PUSH (FOR BLOOD PRESSURE SUPPORT)
PREFILLED_SYRINGE | INTRAVENOUS | Status: AC
  Filled 2016-04-07: qty 10

## 2016-04-07 MED ORDER — SIMETHICONE 80 MG PO CHEW
80.0000 mg | CHEWABLE_TABLET | Freq: Three times a day (TID) | ORAL | Status: DC
  Administered 2016-04-07 – 2016-04-09 (×5): 80 mg via ORAL
  Filled 2016-04-07 (×5): qty 1

## 2016-04-07 MED ORDER — NALOXONE HCL 0.4 MG/ML IJ SOLN: 0.4000 mg | INTRAMUSCULAR | Status: DC | PRN

## 2016-04-07 MED ORDER — LACTATED RINGERS IV SOLN
INTRAVENOUS | Status: DC | PRN
  Administered 2016-04-07 (×2): via INTRAVENOUS

## 2016-04-07 MED ORDER — DIPHENHYDRAMINE HCL 25 MG PO CAPS: 25.0000 mg | ORAL_CAPSULE | ORAL | Status: DC | PRN

## 2016-04-07 MED ORDER — KETOROLAC TROMETHAMINE 30 MG/ML IJ SOLN
30.0000 mg | Freq: Four times a day (QID) | INTRAMUSCULAR | Status: DC | PRN
Start: 2016-04-07 — End: 2016-04-07

## 2016-04-07 MED ORDER — PRENATAL MULTIVITAMIN CH
1.0000 | ORAL_TABLET | Freq: Every day | ORAL | Status: DC
  Administered 2016-04-08: 1 via ORAL
  Filled 2016-04-07: qty 1

## 2016-04-07 MED ORDER — MORPHINE SULFATE (PF) 0.5 MG/ML IJ SOLN
INTRAMUSCULAR | Status: AC
  Filled 2016-04-07: qty 10

## 2016-04-07 MED ORDER — SIMETHICONE 80 MG PO CHEW
80.0000 mg | CHEWABLE_TABLET | ORAL | Status: DC
  Administered 2016-04-08 (×2): 80 mg via ORAL
  Filled 2016-04-07 (×2): qty 1

## 2016-04-07 MED ORDER — NALOXONE HCL 2 MG/2ML IJ SOSY
1.0000 ug/kg/h | PREFILLED_SYRINGE | INTRAMUSCULAR | Status: DC | PRN
  Filled 2016-04-07: qty 2

## 2016-04-07 MED ORDER — ONDANSETRON HCL 4 MG/2ML IJ SOLN: 4.0000 mg | Freq: Three times a day (TID) | INTRAMUSCULAR | Status: DC | PRN

## 2016-04-07 MED ORDER — DIPHENHYDRAMINE HCL 50 MG/ML IJ SOLN: 12.5000 mg | INTRAMUSCULAR | Status: DC | PRN

## 2016-04-07 MED ORDER — FENTANYL CITRATE (PF) 100 MCG/2ML IJ SOLN
INTRAMUSCULAR | Status: AC
  Administered 2016-04-07: 25 ug via INTRAVENOUS
  Filled 2016-04-07: qty 2

## 2016-04-07 MED ORDER — FENTANYL CITRATE (PF) 100 MCG/2ML IJ SOLN
25.0000 ug | INTRAMUSCULAR | Status: DC | PRN
  Administered 2016-04-07: 25 ug via INTRAVENOUS
  Administered 2016-04-07: 50 ug via INTRAVENOUS
  Administered 2016-04-07: 25 ug via INTRAVENOUS

## 2016-04-07 MED ORDER — CARBOPROST TROMETHAMINE 250 MCG/ML IM SOLN
INTRAMUSCULAR | Status: AC
  Filled 2016-04-07: qty 1

## 2016-04-07 MED ORDER — SENNOSIDES-DOCUSATE SODIUM 8.6-50 MG PO TABS
2.0000 | ORAL_TABLET | ORAL | Status: DC
  Administered 2016-04-08 (×2): 2 via ORAL
  Filled 2016-04-07 (×2): qty 2

## 2016-04-07 MED ORDER — CEFAZOLIN SODIUM-DEXTROSE 2-4 GM/100ML-% IV SOLN
INTRAVENOUS | Status: AC
  Filled 2016-04-07: qty 100

## 2016-04-07 MED ORDER — KETOROLAC TROMETHAMINE 30 MG/ML IJ SOLN: 30.0000 mg | Freq: Four times a day (QID) | INTRAMUSCULAR | Status: DC | PRN

## 2016-04-07 MED ORDER — OXYCODONE-ACETAMINOPHEN 5-325 MG PO TABS: 1.0000 | ORAL_TABLET | ORAL | Status: DC | PRN

## 2016-04-07 MED ORDER — DIBUCAINE 1 % RE OINT: 1.0000 "application " | TOPICAL_OINTMENT | RECTAL | Status: DC | PRN

## 2016-04-07 MED ORDER — DIPHENHYDRAMINE HCL 25 MG PO CAPS: 25.0000 mg | ORAL_CAPSULE | Freq: Four times a day (QID) | ORAL | Status: DC | PRN

## 2016-04-07 MED ORDER — ACETAMINOPHEN 325 MG PO TABS
650.0000 mg | ORAL_TABLET | ORAL | Status: DC | PRN
  Administered 2016-04-08: 650 mg via ORAL
  Filled 2016-04-07: qty 2

## 2016-04-07 MED ORDER — LIDOCAINE-EPINEPHRINE (PF) 2 %-1:200000 IJ SOLN
INTRAMUSCULAR | Status: DC | PRN
  Administered 2016-04-07: 5 mL via EPIDURAL
  Administered 2016-04-07: 2 mL via EPIDURAL
  Administered 2016-04-07 (×2): 5 mL via EPIDURAL

## 2016-04-07 MED ORDER — LACTATED RINGERS IV SOLN: INTRAVENOUS | Status: DC

## 2016-04-07 MED ORDER — NALBUPHINE HCL 10 MG/ML IJ SOLN: 5.0000 mg | INTRAMUSCULAR | Status: DC | PRN

## 2016-04-07 MED ORDER — ONDANSETRON HCL 4 MG/2ML IJ SOLN
INTRAMUSCULAR | Status: DC | PRN
  Administered 2016-04-07: 4 mg via INTRAVENOUS

## 2016-04-07 MED ORDER — NALOXONE HCL 2 MG/2ML IJ SOSY
1.0000 ug/kg/h | PREFILLED_SYRINGE | INTRAVENOUS | Status: DC | PRN
  Filled 2016-04-07: qty 2

## 2016-04-07 MED ORDER — WITCH HAZEL-GLYCERIN EX PADS: 1.0000 "application " | MEDICATED_PAD | CUTANEOUS | Status: DC | PRN

## 2016-04-07 MED ORDER — ONDANSETRON HCL 4 MG/2ML IJ SOLN
INTRAMUSCULAR | Status: AC
  Filled 2016-04-07: qty 2

## 2016-04-07 MED ORDER — LACTATED RINGERS IV SOLN
INTRAVENOUS | Status: DC
  Administered 2016-04-07 – 2016-04-08 (×2): via INTRAVENOUS

## 2016-04-07 MED ORDER — SIMETHICONE 80 MG PO CHEW: 80.0000 mg | CHEWABLE_TABLET | ORAL | Status: DC | PRN

## 2016-04-07 MED ORDER — MEPERIDINE HCL 25 MG/ML IJ SOLN: 6.2500 mg | INTRAMUSCULAR | Status: DC | PRN

## 2016-04-07 MED ORDER — SODIUM CHLORIDE 0.9% FLUSH: 3.0000 mL | INTRAVENOUS | Status: DC | PRN

## 2016-04-07 MED ORDER — MENTHOL 3 MG MT LOZG
1.0000 | LOZENGE | OROMUCOSAL | Status: DC | PRN
  Administered 2016-04-08: 3 mg via ORAL
  Filled 2016-04-07: qty 9

## 2016-04-07 SURGICAL SUPPLY — 36 items
BENZOIN TINCTURE PRP APPL 2/3 (GAUZE/BANDAGES/DRESSINGS) ×3 IMPLANT
CLAMP CORD UMBIL (MISCELLANEOUS) IMPLANT
CLOSURE WOUND 1/2 X4 (GAUZE/BANDAGES/DRESSINGS)
CLOTH BEACON ORANGE TIMEOUT ST (SAFETY) ×3 IMPLANT
CONTAINER PREFILL 10% NBF 15ML (MISCELLANEOUS) IMPLANT
DRSG OPSITE POSTOP 4X10 (GAUZE/BANDAGES/DRESSINGS) ×3 IMPLANT
DURAPREP 26ML APPLICATOR (WOUND CARE) ×3 IMPLANT
ELECT REM PT RETURN 9FT ADLT (ELECTROSURGICAL) ×3
ELECTRODE REM PT RTRN 9FT ADLT (ELECTROSURGICAL) ×1 IMPLANT
EXTRACTOR VACUUM KIWI (MISCELLANEOUS) IMPLANT
EXTRACTOR VACUUM M CUP 4 TUBE (SUCTIONS) IMPLANT
EXTRACTOR VACUUM M CUP 4' TUBE (SUCTIONS)
GLOVE BIO SURGEON STRL SZ7 (GLOVE) ×3 IMPLANT
GLOVE BIOGEL PI IND STRL 7.0 (GLOVE) ×2 IMPLANT
GLOVE BIOGEL PI INDICATOR 7.0 (GLOVE) ×4
GOWN STRL REUS W/TWL LRG LVL3 (GOWN DISPOSABLE) ×6 IMPLANT
KIT ABG SYR 3ML LUER SLIP (SYRINGE) IMPLANT
NEEDLE HYPO 25X5/8 SAFETYGLIDE (NEEDLE) IMPLANT
NS IRRIG 1000ML POUR BTL (IV SOLUTION) ×3 IMPLANT
PACK C SECTION WH (CUSTOM PROCEDURE TRAY) ×3 IMPLANT
PAD OB MATERNITY 4.3X12.25 (PERSONAL CARE ITEMS) ×3 IMPLANT
RTRCTR C-SECT PINK 25CM LRG (MISCELLANEOUS) IMPLANT
STRIP CLOSURE SKIN 1/2X4 (GAUZE/BANDAGES/DRESSINGS) IMPLANT
SUT MON AB-0 CT1 36 (SUTURE) ×9 IMPLANT
SUT PLAIN 0 NONE (SUTURE) IMPLANT
SUT PLAIN 2 0 (SUTURE) ×2
SUT PLAIN ABS 2-0 CT1 27XMFL (SUTURE) ×1 IMPLANT
SUT VIC AB 0 CT1 27 (SUTURE) ×4
SUT VIC AB 0 CT1 27XBRD ANBCTR (SUTURE) ×2 IMPLANT
SUT VIC AB 2-0 CT1 27 (SUTURE) ×6
SUT VIC AB 2-0 CT1 TAPERPNT 27 (SUTURE) ×3 IMPLANT
SUT VIC AB 4-0 KS 27 (SUTURE) IMPLANT
SUT VICRYL 0 TIES 12 18 (SUTURE) IMPLANT
TAPE STRIPS DRAPE STRL (GAUZE/BANDAGES/DRESSINGS) ×3 IMPLANT
TOWEL OR 17X24 6PK STRL BLUE (TOWEL DISPOSABLE) ×3 IMPLANT
TRAY FOLEY CATH SILVER 14FR (SET/KITS/TRAYS/PACK) IMPLANT

## 2016-04-07 NOTE — Addendum Note (Signed)
Addendum  created 04/07/16 1645 by Algis GreenhouseLinda A Perez Dirico, CRNA   Modules edited: Clinical Notes   Clinical Notes:  File: [id number]

## 2016-04-07 NOTE — Progress Notes (Signed)
Mercedes Okey DupreCrawford is a 35 y.o. G3P0020 at 489w3d IOL fo A1GDM. No complaints, just tired. Patient has pretty much been in sitting position all night since best tolerated by baby  Fetal intolerance to UCs and position changes, so pt on very low dose pitocin since started with several periods of stopping pitocin for prolonged fetal decels. Overnight, with los dose pitocin and UCs every 4-5 min, there has been some progress with protracted active phase mostly due to inability tohave regular UC pattern every 3 min.   BP 134/85 mmHg  Pulse 93  Temp(Src) 98.3 F (36.8 C) (Oral)  Resp 18  Ht 5\' 1"  (1.549 m)  Wt 157 lb (71.215 kg)  BMI 29.68 kg/m2  SpO2 97%  LMP 06/29/2015 (LMP Unknown)\    FHT:  FHR: 120 bpm, variability: moderate,  accelerations:  Present,  decelerations:  Absent for several hours  UC:   irregular, every 3-5 minutes SVE:   Dilation: 8 Effacement (%): 100 Station: 0 Exam by:: B Boyer RN    Assessment / Plan: Protracted active phase. Possible CPD vs poor labor UCs pattern. Some progress since last few hours, continue pitocin at low dose as tolerated and reassess. Assess descent.  FHT - now category I again.  EFW 7.1/2 lbs MOD: Still guarded but working towards vag delivery.   Juaquina Machnik R 04/07/2016, 7:19 AM

## 2016-04-07 NOTE — Progress Notes (Signed)
Dominique Powers is a 35 y.o. G3P0020 at 766w3d IOL fo A1GDM. Rectal pressure.   BP 123/89 mmHg  Pulse 82  Temp(Src) 98.1 F (36.7 C) (Oral)  Resp 18  Ht 5\' 1"  (1.549 m)  Wt 157 lb (71.215 kg)  BMI 29.68 kg/m2  SpO2 97%  LMP 06/29/2015 (LMP Unknown)  FHT:  FHR: 120 bpm, variability: moderate,  accelerations:  Present,  decelerations:  Absent for several hours  UC:   irregular, every 3-5 minutes SVE:   Dilation: 10 Effacement (%): 100 Station: -1 Exam by:: dr Esco Joslyn   Protracted Active phase, now complete. High station, possible inlet CPD, reassess 1 hr and if not descent, cannot push, will proceed with C/section, pt understands and agrees.     Diahann Guajardo R 04/07/2016, 9:28 AM

## 2016-04-07 NOTE — Op Note (Signed)
Cesarean Section Procedure Note   Dominique FloorChelsi Powers  04/06/2016 - 04/07/2016  Indications:A11GDM induction, protracted active phase and then  Arrest of descent in second stage  Pre-operative Diagnosis: primary cesarean section for arrest of descent.   Post-operative Diagnosis: Same   Surgeon:  Shea EvansVaishali Imara Standiford, MD    Assistants: Mamie Leversracy Tucker, RNFA  Anesthesia: epidural   Procedure Details:  The patient was seen in the Labor Room. The risks, benefits, complications, treatment options, and expected outcomes were discussed with the patient. The patient concurred with the proposed plan, giving informed consent. identified as Dominique Floorhelsi Bramer and the procedure verified as C-Section Delivery. A Time Out was held and the above information confirmed. Gentamicin and Clindamycin given per protocol.  After induction of anesthesia, the patient was draped and prepped in the usual sterile manner and foley was draining blood tinged urine. . A Pfannenstiel Incision was made and carried down through the subcutaneous tissue to the fascia. Fascial incision was made and extended transversely. The fascia was separated from the underlying rectus tissue superiorly and inferiorly. The peritoneum was identified and entered and carefully extended as bladder reflexion was higher. Alexis retractor placed carefully. Lower segment was bulging/ distended. A low transverse uterine incision was made. Delivered from cephalic right occiput transverse presentation was a vigorous healthy female infant at 10.54 am on 04/07/17 with Apgars 8 and 9 at 1 and 5 min. Delayed cord clamping done after 1 minute. Placenta separated spontaneously and was handed off for cord blood collection and cord blood sample. Cord ph was not sent. Placenta appeared normal. The uterine outline, tubes and ovaries appeared normal. The uterine incision was closed with running locked sutures of 0Monocryl followed by another imbricating layer.  Hemostasis was observed.  Alexis retractor removed. Peritoneal closure done with 2-0 vicryl. The fascia was then reapproximated with running sutures of 0Vicryl. The subcuticular closure was performed using 2-0plain gut. The skin was closed with 4-0Vicryl.  Steristrips and sterile dressings placed.  Instrument, sponge, and needle counts were correct prior the abdominal closure and were correct at the conclusion of the case.    Findings: Female infant delivery from right occiput transverse position from deep transverse arrest, born at 10.54 am. Apgars 8 and 9. No extensions. 2 layer closure of hysterotomy done. Normal placenta, cord, ovaries, tubes and uterus.    Estimated Blood Loss: 600 cc  Total IV Fluids: 2400 cc LR  Urine Output: 200CC OF clear urine  Specimens: Cord blood and cord blood donation. Pt will keep her placenta.   Complications: no complications  Disposition: PACU - hemodynamically stable.   Maternal Condition: stable   Baby condition / location:  Couplet care / Skin to Skin  Attending Attestation: I performed the procedure.   Signed: Surgeon(s): Shea EvansVaishali Danise Dehne, MD

## 2016-04-07 NOTE — Anesthesia Postprocedure Evaluation (Signed)
Anesthesia Post Note  Patient: Dominique Powers  Procedure(s) Performed: Procedure(s) (LRB): CESAREAN SECTION (N/A)  Patient location during evaluation: Mother Baby Anesthesia Type: Epidural Level of consciousness: awake Pain management: satisfactory to patient Vital Signs Assessment: post-procedure vital signs reviewed and stable Respiratory status: spontaneous breathing Cardiovascular status: stable Anesthetic complications: no     Last Vitals:  Filed Vitals:   04/07/16 1535 04/07/16 1640  BP: 117/77 107/74  Pulse: 55 62  Temp: 36.8 C 36.9 C  Resp: 18 18    Last Pain:  Filed Vitals:   04/07/16 1642  PainSc: 4    Pain Goal: Patients Stated Pain Goal: 2 (04/07/16 1535)               Cephus ShellingBURGER,[name]

## 2016-04-07 NOTE — Anesthesia Postprocedure Evaluation (Signed)
Anesthesia Post Note  Patient: Dominique Powers  Procedure(s) Performed: Procedure(s) (LRB): CESAREAN SECTION (N/A)  Patient location during evaluation: PACU Anesthesia Type: Epidural Level of consciousness: awake and alert Pain management: pain level controlled Vital Signs Assessment: post-procedure vital signs reviewed and stable Respiratory status: spontaneous breathing and respiratory function stable Cardiovascular status: blood pressure returned to baseline and stable Postop Assessment: no headache, no backache and epidural receding Anesthetic complications: no     Last Vitals:  Filed Vitals:   04/07/16 1305 04/07/16 1316  BP:  112/80  Pulse:  63  Temp: 37.4 C 37.2 C  Resp:  17    Last Pain:  Filed Vitals:   04/07/16 1400  PainSc: 4    Pain Goal:                 Phillips Groutarignan, [name]

## 2016-04-07 NOTE — Transfer of Care (Signed)
Immediate Anesthesia Transfer of Care Note  Patient: Dominique Powers  Procedure(s) Performed: Procedure(s): CESAREAN SECTION (N/A)  Patient Location: PACU  Anesthesia Type:Epidural  Level of Consciousness: awake, alert , oriented and patient cooperative  Airway & Oxygen Therapy: Patient Spontanous Breathing  Post-op Assessment: Report given to RN  Post vital signs: Reviewed and stable  Last Vitals:  TEMP 99.3 BP 127/73 HR 99 RR 12 POX 99  Last Pain:  Pain level 0 Pain goal 3-4       Complications: No apparent anesthesia complications

## 2016-04-07 NOTE — Anesthesia Rounding Note (Signed)
  CRNA Epidural Rounding Note  Patient: Dominique Powers, 35 y.o., female  Patient's current pain level: 0  Agreed upon pain management level: 4  Epidural intervention: No   Comments:   [facility] Surgery CenterEIGHT,[name] 04/07/2016

## 2016-04-07 NOTE — Lactation Note (Signed)
This note was copied from a baby's chart. Lactation Consultation Note  Patient Name: Girl Wilmer FloorChelsi Sopko BJYNW'GToday's Date: 04/07/2016 Reason for consult: Initial assessment Baby at 5 hr of life and mom reports bf is going well. She stated she can manually express. She denies breast pain or soreness, voiced no concerns. Discussed baby behavior, feeding frequency, baby belly size, voids, wt loss, breast changes, and nipple care. Given lactation handouts. Aware of OP services and support group.    Maternal Data Has patient been taught Hand Expression?: Yes Does the patient have breastfeeding experience prior to this delivery?: No  Feeding Feeding Type: Breast Fed Length of feed: 60 min  LATCH Score/Interventions Latch: Grasps breast easily, tongue down, lips flanged, rhythmical sucking.  Audible Swallowing: None  Type of Nipple: Everted at rest and after stimulation  Comfort (Breast/Nipple): Soft / non-tender     Hold (Positioning): No assistance needed to correctly position infant at breast.  LATCH Score: 8  Lactation Tools Discussed/Used WIC Program: No   Consult Status Consult Status: Follow-up Date: 04/08/16 Follow-up type: In-patient    Rulon Eisenmengerlizabeth E Eduardo Honor 04/07/2016, 3:54 PM

## 2016-04-07 NOTE — Progress Notes (Signed)
Patient ID: Dominique Powers, female   DOB: 10-09-1981, 35 y.o.   MRN: [id number] Stable pt. FHT category I Exam- unchanged station -1.  Hematuria noted. Arrest of descent. Proceed with C/secton now.  Risks/complications of surgery reviewed incl infection, bleeding, damage to internal organs including bladder, bowels, ureters, blood vessels, other risks from anesthesia, VTE and delayed complications of any surgery, complications in future surgery reviewed. Also discussed neonatal complications incl difficult delivery, laceration, vacuum assistance, TTN etc. Pt understands and agrees, all concerns addressed.

## 2016-04-08 ENCOUNTER — Encounter (HOSPITAL_COMMUNITY): Payer: Self-pay | Admitting: Obstetrics & Gynecology

## 2016-04-08 LAB — CBC
HCT: 33.5 % — ABNORMAL LOW (ref 36.0–46.0)
Hemoglobin: 11.4 g/dL — ABNORMAL LOW (ref 12.0–15.0)
MCH: 27.7 pg (ref 26.0–34.0)
MCHC: 34 g/dL (ref 30.0–36.0)
MCV: 81.5 fL (ref 78.0–100.0)
PLATELETS: 120 10*3/uL — AB (ref 150–400)
RBC: 4.11 MIL/uL (ref 3.87–5.11)
RDW: 16.2 % — AB (ref 11.5–15.5)
WBC: 16.8 10*3/uL — ABNORMAL HIGH (ref 4.0–10.5)

## 2016-04-08 LAB — CCBB MATERNAL DONOR DRAW

## 2016-04-08 MED ORDER — RHO D IMMUNE GLOBULIN 1500 UNIT/2ML IJ SOSY
300.0000 ug | PREFILLED_SYRINGE | Freq: Once | INTRAMUSCULAR | Status: AC
  Administered 2016-04-08: 300 ug via INTRAVENOUS
  Filled 2016-04-08: qty 2

## 2016-04-08 NOTE — Progress Notes (Signed)
Patient ID: Dominique FloorChelsi Powers, female   DOB: [birth year], 35 y.o.   MRN: [id number] Subjective: S/P Primary Cesarean Delivery for Arrest of Descent POD# 1 Information for the patient's newborn:  Dominique Powers, Girl Avarey [[id number]][[id number]]  female  Reports feeling well. Feeding: breast Patient reports tolerating PO.  Breast symptoms: none Pain controlled with ibuprofen (OTC) and narcotic analgesics including Percocet  C/O mild dizziness and "not feeling right", but better since removing Scopolamine patch. Denies HA/SOB/C/P/N/V. Flatus present. No BM. She reports vaginal bleeding as normal, without clots.  She is ambulating, urinating without difficult.     Objective:   VS:  Filed Vitals:   04/07/16 1640 04/07/16 2020 04/08/16 0037 04/08/16 0530  BP: 107/74 110/73 101/72 117/71  Pulse: 62 61 69 71  Temp: 98.4 F (36.9 C) 98.3 F (36.8 C) 98.3 F (36.8 C) 98.4 F (36.9 C)  TempSrc: Oral Oral Oral Oral  Resp: 18 18 18 18   Height:      Weight:      SpO2: 95% 97% 96%      Intake/Output Summary (Last 24 hours) at 04/08/16 0843 Last data filed at 04/08/16 0023  Gross per 24 hour  Intake   3375 ml  Output   2625 ml  Net    750 ml        Recent Labs  04/06/16 0105 04/08/16 0743  WBC 8.0 16.8*  HGB 13.4 11.4*  HCT 38.9 33.5*  PLT 150 120*     Blood type: B NEG (06/21 0105) / Infant Rh POS / Rhophylac indicated  Rubella: Immune (01/04 0846)     Physical Exam:   General: alert, cooperative, fatigued and no distress  CV: Regular rate and rhythm, S1S2 present or without murmur or extra heart sounds  Resp: clear  Abdomen: soft, nontender, normal bowel sounds  Incision: dry, intact and small area of dried serous drainage present - borders marked  Uterine Fundus: firm, 1 FB below umbilicus, nontender  Lochia: minimal  Ext: extremities normal, atraumatic, no cyanosis or edema, Homans sign is negative, no sign of DVT and no edema, redness or tenderness in the calves or  thighs   Assessment/Plan: 35 y.o.   POD# 1.  S/P Cesarean Delivery.  Indications: arrest of descent                Principal Problem:   Postpartum care following cesarean delivery (6/22) Active Problems:   Pregnancy   GDM, class A1   Cesarean delivery delivered  Doing well, stable.               Regular diet as tolerated D/C foley per unit protocol Ambulate 2-3 times in hallway Routine post-op care Consider early d/c home tomorrow  Raelyn MoraAWSON, Felma Pfefferle, M, MSN, CNM 04/08/2016, 8:43 AM

## 2016-04-08 NOTE — Lactation Note (Signed)
This note was copied from a baby's chart. Lactation Consultation Note  Patient Name: Dominique Wilmer FloorChelsi Gregori WJXBJ'YToday's Date: 04/08/2016 Reason for consult: Follow-up assessment Baby at 26 hr of life. Mom is reporting bilateral sore nipples, no skin break down noted, offered coconut oil. Requested coconut oil from RN. Mom thinks baby had a few shallow latches but is getting better. FOB is present and supportive. He is helping mom get baby into position. Discussed baby behavior, feeding frequency, baby belly size, voids, wt loss, breast changes, and nipple care. Mom knows how to manually express. She is aware of lactation services and support group. She will call as needed.    Maternal Data    Feeding Feeding Type: Breast Fed  LATCH Score/Interventions Latch: Grasps breast easily, tongue down, lips flanged, rhythmical sucking.  Audible Swallowing: A few with stimulation Intervention(s): Hand expression;Skin to skin Intervention(s): Alternate breast massage  Type of Nipple: Everted at rest and after stimulation  Comfort (Breast/Nipple): Filling, red/small blisters or bruises, mild/mod discomfort  Problem noted: Mild/Moderate discomfort Interventions (Mild/moderate discomfort): Hand expression  Hold (Positioning): No assistance needed to correctly position infant at breast. Intervention(s): Support Pillows;Position options  LATCH Score: 8  Lactation Tools Discussed/Used     Consult Status Consult Status: Follow-up Date: 04/09/16 Follow-up type: In-patient    Dominique Powers 04/08/2016, 1:25 PM

## 2016-04-09 LAB — TYPE AND SCREEN
ABO/RH(D): B NEG
Antibody Screen: POSITIVE
DAT, IGG: NEGATIVE
UNIT DIVISION: 0
UNIT DIVISION: 0

## 2016-04-09 LAB — RH IG WORKUP (INCLUDES ABO/RH)
ABO/RH(D): B NEG
FETAL SCREEN: NEGATIVE
Gestational Age(Wks): 38
Unit division: 0

## 2016-04-09 MED ORDER — IBUPROFEN 600 MG PO TABS: 600.0000 mg | ORAL_TABLET | Freq: Four times a day (QID) | ORAL | Status: DC

## 2016-04-09 MED ORDER — OXYCODONE-ACETAMINOPHEN 5-325 MG PO TABS: 1.0000 | ORAL_TABLET | ORAL | Status: DC | PRN

## 2016-04-09 NOTE — Progress Notes (Signed)
Patient ID: Dominique Powers, female   DOB: [birth year], 35 y.o.   MRN: [id number] Subjective: S/P Primary Cesarean Delivery for Arrest of Descent POD# 2 Information for the patient's newborn:  [name], Girl Alcie [[id number]][[id number]]  female   Reports feeling well. Ready for D/C home today. Feeding: breast Patient reports tolerating PO.  Breast symptoms: none Pain controlled with ibuprofen (OTC) and narcotic analgesics including Percocet  Denies HA/SOB/C/P/N/V/Dizziness. Flatus present. No BM. She reports vaginal bleeding as normal, without clots.  She is ambulating, urinating without difficult.     Objective:   VS:  Filed Vitals:   04/08/16 0530 04/08/16 1030 04/08/16 1755 04/09/16 0511  BP: 117/71 99/62 126/85 128/77  Pulse: 71 76 80 77  Temp: 98.4 F (36.9 C) 98.2 F (36.8 C) 97.7 F (36.5 C) 98.1 F (36.7 C)  TempSrc: Oral Oral Oral Oral  Resp: 18 18 18 18   Height:      Weight:      SpO2:  99%        Intake/Output Summary (Last 24 hours) at 04/09/16 0849 Last data filed at 04/08/16 1100  Gross per 24 hour  Intake      0 ml  Output    400 ml  Net   -400 ml         Recent Labs  04/08/16 0743  WBC 16.8*  HGB 11.4*  HCT 33.5*  PLT 120*     Blood type: B NEG (06/21 0105) / Infant Rh POS / Rhophylac done 04/08/2016  Rubella: Immune (01/04 0846)     Physical Exam:   General: alert, cooperative, fatigued and no distress  CV: Regular rate and rhythm, S1S2 present or without murmur or extra heart sounds  Resp: clear  Abdomen: soft, nontender, normal bowel sounds  Incision: dry, intact and small area of dried serous drainage present - borders marked  Uterine Fundus: firm, 2 FB below umbilicus, nontender  Lochia: minimal  Ext: extremities normal, atraumatic, no cyanosis or edema, Homans sign is negative, no sign of DVT and no edema, redness or tenderness in the calves or thighs   Assessment/Plan: 35 y.o.   POD# 2.  S/P Cesarean Delivery.  Indications: arrest of  descent                Principal Problem:   Postpartum care following cesarean delivery (6/22) Active Problems:   Pregnancy   GDM, class A1   Cesarean delivery delivered  Doing well, stable.               Regular diet as tolerated Ambulate 2-3 times in hallway Routine post-op care Early d/c home today  Raelyn MoraDAWSON, Asheton Viramontes, M, MSN, CNM 04/09/2016, 8:49 AM

## 2016-04-09 NOTE — Discharge Summary (Signed)
OB Discharge Summary     Patient Name: Dominique FloorChelsi Waterfield DOB: Feb 22, 1981 MRN: [id number]  Date of admission: 04/06/2016 Delivering MD: MODY, VAISHALI   Date of discharge: 04/09/2016  Admitting diagnosis: INDUCTION Intrauterine pregnancy: 6435w3d     Secondary diagnosis:  Principal Problem:   Postpartum care following cesarean delivery (6/22) Active Problems:   Pregnancy   GDM, class A1   Cesarean delivery delivered  Additional problems: none     Discharge diagnosis: Term Pregnancy Delivered                                                                                                Post partum procedures:none  Augmentation: AROM and Pitocin  Complications: None  Hospital course:  Induction of Labor With Cesarean Section  35 y.o. yo [id number] at 5535w3d was admitted to the hospital 04/06/2016 for induction of labor. Patient had a labor course significant for completely dilated and arrest of descent at -1 station. The patient went for cesarean section due to Arrest of Descent, and delivered a Viable infant. Membrane Rupture Time/Date: )2:23 PM ,04/06/2016  Details of operation can be found in separate operative Note.  Patient had an uncomplicated postpartum course. She is ambulating, tolerating a regular diet, passing flatus, and urinating well.  Patient is discharged home in stable condition on 04/09/2016.                                     Physical exam  Filed Vitals:   04/08/16 0530 04/08/16 1030 04/08/16 1755 04/09/16 0511  BP: 117/71 99/62 126/85 128/77  Pulse: 71 76 80 77  Temp: 98.4 F (36.9 C) 98.2 F (36.8 C) 97.7 F (36.5 C) 98.1 F (36.7 C)  TempSrc: Oral Oral Oral Oral  Resp: 18 18 18 18   Height:      Weight:      SpO2:  99%     General: alert, cooperative and no distress Lochia: appropriate Uterine Fundus: firm, midline, U-2 Incision: Healing well with no significant drainage, No significant erythema, Dressing is clean, dry, and intact DVT Evaluation: No  evidence of DVT seen on physical exam. Negative Homan's sign. No cords or calf tenderness. No significant calf/ankle edema. Labs: Lab Results  Component Value Date   WBC 16.8* 04/08/2016   HGB 11.4* 04/08/2016   HCT 33.5* 04/08/2016   MCV 81.5 04/08/2016   PLT 120* 04/08/2016   No flowsheet data found.  Discharge instruction: per After Visit Summary and "Baby and Me Booklet".  After visit meds:    Medication List    TAKE these medications        acetaminophen 500 MG tablet  Commonly known as:  TYLENOL  Take 1,000 mg by mouth every 6 (six) hours as needed for moderate pain or headache.     EVENING PRIMROSE OIL PO  Take 1 capsule by mouth 2 (two) times daily. Take 1 capsule by mouth once daily, and then place one capsule vaginally daily.     ibuprofen 600 MG tablet  Commonly known as:  ADVIL,MOTRIN  Take 1 tablet (600 mg total) by mouth every 6 (six) hours.     loratadine 10 MG tablet  Commonly known as:  CLARITIN  Take 1 tablet (10 mg total) by mouth daily.     oxyCODONE-acetaminophen 5-325 MG tablet  Commonly known as:  PERCOCET/ROXICET  Take 1 tablet by mouth every 4 (four) hours as needed (pain scale 4-7).     PRENATAL VITAMINS PLUS 27-1 MG Tabs  Take 1 tablet by mouth daily.        Diet: routine diet  Activity: Advance as tolerated. Pelvic rest for 6 weeks.   Outpatient follow up:6 weeks Follow up Appt:No future appointments. Follow up Visit:No Follow-up on file.  Postpartum contraception: Undecided  Newborn Data: Live born female on 04/07/2016  Birth Weight: 7 lb 0.9 oz (3200 g) APGAR: 8, 9  Baby Feeding: Breast Disposition:home with mother   04/09/2016 Raelyn MoraAWSON, Ariane Ditullio, Judie PetitM, CNM

## 2016-04-09 NOTE — Lactation Note (Signed)
This note was copied from a baby's chart. Lactation Consultation Note; Follow up visit before DC. Parents asking about supplementing. Encouraged to always breast feed with feeding cues. For Ped visit on Monday. Mom reports she knows hand expression and spoon feeding. Can spoon feed EBM if needed. Baby asleep in mom's arms. Encouraged to continue diary of feedings and diaper changes. Encouragement given. Reviewed our phone number to call with questions, OP appointments and BFSG as resources afterDC. To call prn  Patient Name: Dominique Powers WGNFA'OToday's Date: 04/09/2016 Reason for consult: Follow-up assessment   Maternal Data Formula Feeding for Exclusion: No Has patient been taught Hand Expression?: Yes Does the patient have breastfeeding experience prior to this delivery?: No  Feeding Feeding Type: Breast Fed  LATCH Score/Interventions Latch: Grasps breast easily, tongue down, lips flanged, rhythmical sucking.  Audible Swallowing: A few with stimulation  Type of Nipple: Everted at rest and after stimulation  Comfort (Breast/Nipple): Filling, red/small blisters or bruises, mild/mod discomfort  Problem noted: Mild/Moderate discomfort Interventions (Mild/moderate discomfort):  (coconut oil)  Hold (Positioning): No assistance needed to correctly position infant at breast.  LATCH Score: 8  Lactation Tools Discussed/Used     Consult Status Consult Status: Complete    Pamelia HoitWeeks, Ronni Osterberg D 04/09/2016, 11:13 AM

## 2016-04-09 NOTE — Discharge Instructions (Signed)
Breast Pumping Tips °If you are breastfeeding, there may be times when you cannot feed your baby directly. Returning to work or going on a trip are common examples. Pumping allows you to store breast milk and feed it to your baby later.  °You may not get much milk when you first start to pump. Your breasts should start to make more after a few days. If you pump at the times you usually feed your baby, you may be able to keep making enough milk to feed your baby without also using formula. The more often you pump, the more milk you will produce.  °WHEN SHOULD I PUMP?  °· You can begin to pump soon after delivery. However, some experts recommend waiting about 4 weeks before giving your infant a bottle to make sure breastfeeding is going well.  °· If you plan to return to work, begin pumping a few weeks before. This will help you develop techniques that work best for you. It also lets you build up a supply of breast milk.   °· When you are with your infant, feed on demand and pump after each feeding.   °· When you are away from your infant for several hours, pump for about 15 minutes every 2-3 hours. Pump both breasts at the same time if you can.   °· If your infant has a formula feeding, make sure to pump around the same time.     °· If you drink any alcohol, wait 2 hours before pumping.   °HOW DO I PREPARE TO PUMP? °Your let-down reflex is the natural reaction to stimulation that makes your breast milk flow. It is easier to stimulate this reflex when you are relaxed. Find relaxation techniques that work for you. If you have difficulty with your let-down reflex, try these methods:  °· Smell one of your infant's blankets or an item of clothing.   °· Look at a picture or video of your infant.   °· Sit in a quiet, private space.   °· Massage the breast you plan to pump.   °· Place soothing warmth on the breast.   °· Play relaxing music.   °WHAT ARE SOME GENERAL BREAST PUMPING TIPS? °· Wash your hands before you pump. You  do not need to wash your nipples or breasts. °· There are three ways to pump. °· You can use your hand to massage and compress your breast. °· You can use a handheld manual pump. °· You can use an electric pump.   °· Make sure the suction cup (flange) on the breast pump is the right size. Place the flange directly over the nipple. If it is the wrong size or placed the wrong way, it may be painful and cause nipple damage.   °· If pumping is uncomfortable, apply a small amount of purified or modified lanolin to your nipple and areola. °· If you are using an electric pump, adjust the speed and suction power to be more comfortable. °· If pumping is painful or if you are not getting very much milk, you may need a different type of pump. A lactation consultant can help you determine what type of pump to use.   °· Keep a full water bottle near you at all times. Drinking lots of fluid helps you make more milk.  °· You can store your milk to use later. Pumped breast milk can be stored in a sealable, sterile container or plastic bag. Label all stored breast milk with the date you pumped it. °· Milk can stay out at room temperature for up to 8 hours. °·   You can store your milk in the refrigerator for up to 8 days.  You can store your milk in the freezer for 3 months. Thaw frozen milk using warm water. Do not put it in the microwave.  Do not smoke. Smoking can lower your milk supply and harm your infant. If you need help quitting, ask your health care provider to recommend a program.  [place] A LACTATION CONSULTANT?  You are having trouble pumping.  You are concerned that you are not making enough milk.  You have nipple pain, soreness, or redness.  You want to use birth control. Birth control pills may lower your milk supply. Talk to your health care provider about your options.   This information is not intended to replace advice given to you by your health care provider.  Make sure you discuss any questions you have with your health care provider.   Document Released: 03/23/2010 Document Revised: 10/08/2013 Document Reviewed: 07/26/2013 Elsevier Interactive Patient Education 2016 [employer].  Kegel Exercises The goal of Kegel exercises is to isolate and exercise your pelvic floor muscles. These muscles act as a hammock that supports the rectum, vagina, small intestine, and uterus. As the muscles weaken, the hammock sags and these organs are displaced from their normal positions. Kegel exercises can strengthen your pelvic floor muscles and help you to improve bladder and bowel control, improve sexual response, and help reduce many problems and some discomfort during pregnancy. Kegel exercises can be done anywhere and at any time. HOW TO PERFORM [place] your pelvic floor muscles. To do this, squeeze (contract) the muscles that you use when you try to stop the flow of urine. You will feel a tightness in the vaginal area (women) and a tight lift in the rectal area (men and women).  When you begin, contract your pelvic muscles tight for 2-5 seconds, then relax them for 2-5 seconds. This is one set. Do 4-5 sets with a short pause in between.  Contract your pelvic muscles for 8-10 seconds, then relax them for 8-10 seconds. Do 4-5 sets. If you cannot contract your pelvic muscles for 8-10 seconds, try 5-7 seconds and work your way up to 8-10 seconds. Your goal is 4-5 sets of 10 contractions each day. Keep your stomach, buttocks, and legs relaxed during the exercises. Perform sets of both short and long contractions. Vary your positions. Perform these contractions 3-4 times per day. Perform sets while you are:   Lying in bed in the morning.  Standing at lunch.  Sitting in the late afternoon.  Lying in bed at night. You should do 40-50 contractions per day. Do not perform more Kegel exercises per day than recommended. Overexercising can cause muscle  fatigue. Continue these exercises for for at least 15-20 weeks or as directed by your caregiver.   This information is not intended to replace advice given to you by your health care provider. Make sure you discuss any questions you have with your health care provider.   Document Released: 09/19/2012 Document Revised: 10/24/2014 Document Reviewed: 09/19/2012 Elsevier Interactive Patient Education 2016 [employer]. Postpartum Depression and Baby Blues The postpartum period begins right after the birth of a baby. During this time, there is often a great amount of joy and excitement. It is also a time of many changes in the life of the parents. Regardless of how many times a mother gives birth, each child brings new challenges and dynamics to  the family. It is not unusual to have feelings of excitement along with confusing shifts in moods, emotions, and thoughts. All mothers are at risk of developing postpartum depression or the "baby blues." These mood changes can occur right after giving birth, or they may occur many months after giving birth. The baby blues or postpartum depression can be mild or severe. Additionally, postpartum depression can go away rather quickly, or it can be a long-term condition.  CAUSES Raised hormone levels and the rapid drop in those levels are thought to be a main cause of postpartum depression and the baby blues. A number of hormones change during and after pregnancy. Estrogen and progesterone usually decrease right after the delivery of your baby. The levels of thyroid hormone and various cortisol steroids also rapidly drop. Other factors that play a role in these mood changes include major life events and genetics.  RISK FACTORS If you have any of the following risks for the baby blues or postpartum depression, know what symptoms to watch out for during the postpartum period. Risk factors that may increase the likelihood of getting the baby blues or postpartum depression  include:  Having a personal or family history of depression.   Having depression while being pregnant.   Having premenstrual mood issues or mood issues related to oral contraceptives.  Having a lot of life stress.   Having marital conflict.   Lacking a social support network.   Having a baby with special needs.   Having health problems, such as diabetes.  SIGNS AND SYMPTOMS Symptoms of baby blues include:  Brief changes in mood, such as going from extreme happiness to sadness.  Decreased concentration.   Difficulty sleeping.   Crying spells, tearfulness.   Irritability.   Anxiety.  Symptoms of postpartum depression typically begin within the first month after giving birth. These symptoms include:  Difficulty sleeping or excessive sleepiness.   Marked weight loss.   Agitation.   Feelings of worthlessness.   Lack of interest in activity or food.  Postpartum psychosis is a very serious condition and can be dangerous. Fortunately, it is rare. Displaying any of the following symptoms is cause for immediate medical attention. Symptoms of postpartum psychosis include:   Hallucinations and delusions.   Bizarre or disorganized behavior.   Confusion or disorientation.  DIAGNOSIS  A diagnosis is made by an evaluation of your symptoms. There are no medical or lab tests that lead to a diagnosis, but there are various questionnaires that a health care provider may use to identify those with the baby blues, postpartum depression, or psychosis. Often, a screening tool called the [place] Postnatal Depression Scale is used to diagnose depression in the postpartum period.  TREATMENT The baby blues usually goes away on its own in 1-2 weeks. Social support is often all that is needed. You will be encouraged to get adequate sleep and rest. Occasionally, you may be given medicines to help you sleep.  Postpartum depression requires treatment because it can last  several months or longer if it is not treated. Treatment may include individual or group therapy, medicine, or both to address any social, physiological, and psychological factors that may play a role in the depression. Regular exercise, a healthy diet, rest, and social support may also be strongly recommended.  Postpartum psychosis is more serious and needs treatment right away. Hospitalization is often needed. HOME CARE INSTRUCTIONS  Get as much rest as you can. Nap when the baby sleeps.   Exercise regularly. Some  women find yoga and walking to be beneficial.   Eat a balanced and nourishing diet.   Do little things that you enjoy. Have a cup of tea, take a bubble bath, read your favorite magazine, or listen to your favorite music.  Avoid alcohol.   Ask for help with household chores, cooking, grocery shopping, or running errands as needed. Do not try to do everything.   Talk to people close to you about how you are feeling. Get support from your partner, family members, friends, or other new moms.  Try to stay positive in how you think. Think about the things you are grateful for.   Do not spend a lot of time alone.   Only take over-the-counter or prescription medicine as directed by your health care provider.  Keep all your postpartum appointments.   Let your health care provider know if you have any concerns.  SEEK MEDICAL CARE IF: You are having a reaction to or problems with your medicine. SEEK IMMEDIATE MEDICAL CARE IF:  You have suicidal feelings.   You think you may harm the baby or someone else. MAKE SURE YOU:  Understand these instructions.  Will watch your condition.  Will get help right away if you are not doing well or get worse.   This information is not intended to replace advice given to you by your health care provider. Make sure you discuss any questions you have with your health care provider.   Document Released: 07/07/2004 Document Revised:  10/08/2013 Document Reviewed: 07/15/2013 Elsevier Interactive Patient Education 2016 [employer] Inc. Postpartum Care After Cesarean Delivery After you deliver your newborn (postpartum period), the usual stay in the hospital is 24-72 hours. If there were problems with your labor or delivery, or if you have other medical problems, you might be in the hospital longer.  While you are in the hospital, you will receive help and instructions on how to care for yourself and your newborn during the postpartum period.  While you are in the hospital:  It is normal for you to have pain or discomfort from the incision in your abdomen. Be sure to tell your nurses when you are having pain, where the pain is located, and what makes the pain worse.  If you are breastfeeding, you may feel uncomfortable contractions of your uterus for a couple of weeks. This is normal. The contractions help your uterus get back to normal size.  It is normal to have some bleeding after delivery.  For the first 1-3 days after delivery, the flow is red and the amount may be similar to a period.  It is common for the flow to start and stop.  In the first few days, you may pass some small clots. Let your nurses know if you begin to pass large clots or your flow increases.  Do not  flush blood clots down the toilet before having the nurse look at them.  During the next 3-10 days after delivery, your flow should become more watery and pink or brown-tinged in color.  Ten to fourteen days after delivery, your flow should be a small amount of yellowish-white discharge.  The amount of your flow will decrease over the first few weeks after delivery. Your flow may stop in 6-8 weeks. Most women have had their flow stop by 12 weeks after delivery.  You should change your sanitary pads frequently.  Wash your hands thoroughly with soap and water for at least 20 seconds after changing pads, using the  toilet, or before holding or feeding your  newborn.  Your intravenous (IV) tubing will be removed when you are drinking enough fluids.  The urine drainage tube (urinary catheter) that was inserted before delivery may be removed within 6-8 hours after delivery or when feeling returns to your legs. You should feel like you need to empty your bladder within the first 6-8 hours after the catheter has been removed.  In case you become weak, lightheaded, or faint, call your nurse before you get out of bed for the first time and before you take a shower for the first time.  Within the first few days after delivery, your breasts may begin to feel tender and full. This is called engorgement. Breast tenderness usually goes away within 48-72 hours after engorgement occurs. You may also notice milk leaking from your breasts. If you are not breastfeeding, do not stimulate your breasts. Breast stimulation can make your breasts produce more milk.  Spending as much time as possible with your newborn is very important. During this time, you and your newborn can feel close and get to know each other. Having your newborn stay in your room (rooming in) will help to strengthen the bond with your newborn. It will give you time to get to know your newborn and become comfortable caring for your newborn.  Your hormones change after delivery. Sometimes the hormone changes can temporarily cause you to feel sad or tearful. These feelings should not last more than a few days. If these feelings last longer than that, you should talk to your caregiver.  If desired, talk to your caregiver about methods of family planning or contraception.  Talk to your caregiver about immunizations. Your caregiver may want you to have the following immunizations before leaving the hospital:  Tetanus, diphtheria, and pertussis (Tdap) or tetanus and diphtheria (Td) immunization. It is very important that you and your family (including grandparents) or others caring for your newborn are  up-to-date with the Tdap or Td immunizations. The Tdap or Td immunization can help protect your newborn from getting ill.  Rubella immunization.  Varicella (chickenpox) immunization.  Influenza immunization. You should receive this annual immunization if you did not receive the immunization during your pregnancy.   This information is not intended to replace advice given to you by your health care provider. Make sure you discuss any questions you have with your health care provider.   Document Released: 06/27/2012 Document Reviewed: 06/27/2012 Elsevier Interactive Patient Education 2016 [employer]. Breastfeeding and Mastitis Mastitis is inflammation of the breast tissue. It can occur in women who are breastfeeding. This can make breastfeeding painful. Mastitis will sometimes go away on its own. Your health care provider will help determine if treatment is needed. CAUSES Mastitis is often associated with a blocked milk (lactiferous) duct. This can happen when too much milk builds up in the breast. Causes of excess milk in the breast can include:  Poor latch-on. If your baby is not latched onto the breast properly, she or he may not empty your breast completely while breastfeeding.  Allowing too much time to pass between feedings.  Wearing a bra or other clothing that is too tight. This puts extra pressure on the lactiferous ducts so milk does not flow through them as it should. Mastitis can also be caused by a bacterial infection. Bacteria may enter the breast tissue through cuts or openings in the skin. In women who are breastfeeding, this may occur because of cracked or irritated skin.  Cracks in the skin are often caused when your baby does not latch on properly to the breast. SIGNS AND SYMPTOMS  Swelling, redness, tenderness, and pain in an area of the breast.  Swelling of the glands under the arm on the same side.  Fever may or may not accompany mastitis. If an infection is  allowed to progress, a collection of pus (abscess) may develop. DIAGNOSIS  Your health care provider can usually diagnose mastitis based on your symptoms and a physical exam. Tests may be done to help confirm the diagnosis. These may include:  Removal of pus from the breast by applying pressure to the area. This pus can be examined in the lab to determine which bacteria are present. If an abscess has developed, the fluid in the abscess can be removed with a needle. This can also be used to confirm the diagnosis and determine the bacteria present. In most cases, pus will not be present.  Blood tests to determine if your body is fighting a bacterial infection.  Mammogram or ultrasound tests to rule out other problems or diseases. TREATMENT  Mastitis that occurs with breastfeeding will sometimes go away on its own. Your health care provider may choose to wait 24 hours after first seeing you to decide whether a prescription medicine is needed. If your symptoms are worse after 24 hours, your health care provider will likely prescribe an antibiotic medicine to treat the mastitis. He or she will determine which bacteria are most likely causing the infection and will then select an appropriate antibiotic medicine. This is sometimes changed based on the results of tests performed to identify the bacteria, or if there is no response to the antibiotic medicine selected. Antibiotic medicines are usually given by mouth. You may also be given medicine for pain. HOME CARE INSTRUCTIONS  Only take over-the-counter or prescription medicines for pain, fever, or discomfort as directed by your health care provider.  If your health care provider prescribed an antibiotic medicine, take the medicine as directed. Make sure you finish it even if you start to feel better.  Do not wear a tight or underwire bra. Wear a soft, supportive bra.  Increase your fluid intake, especially if you have a fever.  Continue to empty the  breast. Your health care provider can tell you whether this milk is safe for your infant or needs to be thrown out. You may be told to stop nursing until your health care provider thinks it is safe for your baby. Use a breast pump if you are advised to stop nursing.  Keep your nipples clean and dry.  Empty the first breast completely before going to the other breast. If your baby is not emptying your breasts completely for some reason, use a breast pump to empty your breasts.  If you go back to work, pump your breasts while at work to stay in time with your nursing schedule.  Avoid allowing your breasts to become overly filled with milk (engorged). SEEK MEDICAL CARE IF:  You have pus-like discharge from the breast.  Your symptoms do not improve with the treatment prescribed by your health care provider within 2 days. SEEK IMMEDIATE MEDICAL CARE IF:  Your pain and swelling are getting worse.  You have pain that is not controlled with medicine.  You have a red line extending from the breast toward your armpit.  You have a fever or persistent symptoms for more than 2-3 days.  You have a fever and your symptoms suddenly  get worse. MAKE SURE YOU:   Understand these instructions.  Will watch your condition.  Will get help right away if you are not doing well or get worse.   This information is not intended to replace advice given to you by your health care provider. Make sure you discuss any questions you have with your health care provider.   Document Released: 01/28/2005 Document Revised: 10/08/2013 Document Reviewed: 05/09/2013 Elsevier Interactive Patient Education [employer] Breastfeeding Deciding to breastfeed is one of the best choices you can make for you and your baby. A change in hormones during pregnancy causes your breast tissue to grow and increases the number and size of your milk ducts. These hormones also allow proteins, sugars, and fats from your blood supply  to make breast milk in your milk-producing glands. Hormones prevent breast milk from being released before your baby is born as well as prompt milk flow after birth. Once breastfeeding has begun, thoughts of your baby, as well as his or her sucking or crying, can stimulate the release of milk from your milk-producing glands.  BENEFITS OF BREASTFEEDING For Your Baby  Your first milk (colostrum) helps your baby's digestive system function better.  There are antibodies in your milk that help your baby fight off infections.  Your baby has a lower incidence of asthma, allergies, and sudden infant death syndrome.  The nutrients in breast milk are better for your baby than infant formulas and are designed uniquely for your baby's needs.  Breast milk improves your baby's brain development.  Your baby is less likely to develop other conditions, such as childhood obesity, asthma, or type 2 diabetes mellitus. For You  Breastfeeding helps to create a very special bond between you and your baby.  Breastfeeding is convenient. Breast milk is always available at the correct temperature and costs nothing.  Breastfeeding helps to burn calories and helps you lose the weight gained during pregnancy.  Breastfeeding makes your uterus contract to its prepregnancy size faster and slows bleeding (lochia) after you give birth.   Breastfeeding helps to lower your risk of developing type 2 diabetes mellitus, osteoporosis, and breast or ovarian cancer later in life. SIGNS THAT YOUR BABY IS HUNGRY Early Signs of Hunger  Increased alertness or activity.  Stretching.  Movement of the head from side to side.  Movement of the head and opening of the mouth when the corner of the mouth or cheek is stroked (rooting).  Increased sucking sounds, smacking lips, cooing, sighing, or squeaking.  Hand-to-mouth movements.  Increased sucking of fingers or hands. Late Signs of Hunger  Fussing.  Intermittent  crying. Extreme Signs of Hunger Signs of extreme hunger will require calming and consoling before your baby will be able to breastfeed successfully. Do not wait for the following signs of extreme hunger to occur before you initiate breastfeeding:  Restlessness.  A loud, strong cry.  Screaming. BREASTFEEDING BASICS Breastfeeding Initiation  Find a comfortable place to sit or lie down, with your neck and back well supported.  Place a pillow or rolled up blanket under your baby to bring him or her to the level of your breast (if you are seated). Nursing pillows are specially designed to help support your arms and your baby while you breastfeed.  Make sure that your baby's abdomen is facing your abdomen.  Gently massage your breast. With your fingertips, massage from your chest wall toward your nipple in a circular motion. This encourages milk flow. You may need to  continue this action during the feeding if your milk flows slowly.  Support your breast with 4 fingers underneath and your thumb above your nipple. Make sure your fingers are well away from your nipple and your baby's mouth.  Stroke your baby's lips gently with your finger or nipple.  When your baby's mouth is open wide enough, quickly bring your baby to your breast, placing your entire nipple and as much of the colored area around your nipple (areola) as possible into your baby's mouth.  More areola should be visible above your baby's upper lip than below the lower lip.  Your baby's tongue should be between his or her lower gum and your breast.  Ensure that your baby's mouth is correctly positioned around your nipple (latched). Your baby's lips should create a seal on your breast and be turned out (everted).  It is common for your baby to suck about 2-3 minutes in order to start the flow of breast milk. Latching Teaching your baby how to latch on to your breast properly is very important. An improper latch can cause nipple  pain and decreased milk supply for you and poor weight gain in your baby. Also, if your baby is not latched onto your nipple properly, he or she may swallow some air during feeding. This can make your baby fussy. Burping your baby when you switch breasts during the feeding can help to get rid of the air. However, teaching your baby to latch on properly is still the best way to prevent fussiness from swallowing air while breastfeeding. Signs that your baby has successfully latched on to your nipple:  Silent tugging or silent sucking, without causing you pain.  Swallowing heard between every 3-4 sucks.  Muscle movement above and in front of his or her ears while sucking. Signs that your baby has not successfully latched on to nipple:  Sucking sounds or smacking sounds from your baby while breastfeeding.  Nipple pain. If you think your baby has not latched on correctly, slip your finger into the corner of your baby's mouth to break the suction and place it between your baby's gums. Attempt breastfeeding initiation again. Signs of Successful Breastfeeding Signs from your baby:  A gradual decrease in the number of sucks or complete cessation of sucking.  Falling asleep.  Relaxation of his or her body.  Retention of a small amount of milk in his or her mouth.  Letting go of your breast by himself or herself. Signs from you:  Breasts that have increased in firmness, weight, and size 1-3 hours after feeding.  Breasts that are softer immediately after breastfeeding.  Increased milk volume, as well as a change in milk consistency and color by the fifth day of breastfeeding.  Nipples that are not sore, cracked, or bleeding. Signs That Your Pecola LeisureBaby is Getting Enough Milk  Wetting at least 3 diapers in a 24-hour period. The urine should be clear and pale yellow by age 36 days.  At least 3 stools in a 24-hour period by age 36 days. The stool should be soft and yellow.  At least 3 stools in a  24-hour period by age 452 days. The stool should be seedy and yellow.  No loss of weight greater than 10% of birth weight during the first 593 days of age.  Average weight gain of 4-7 ounces (113-198 g) per week after age 45 days.  Consistent daily weight gain by age 36 days, without weight loss after the age of 2  weeks. After a feeding, your baby may spit up a small amount. This is common. BREASTFEEDING FREQUENCY AND DURATION Frequent feeding will help you make more milk and can prevent sore nipples and breast engorgement. Breastfeed when you feel the need to reduce the fullness of your breasts or when your baby shows signs of hunger. This is called "breastfeeding on demand." Avoid introducing a pacifier to your baby while you are working to establish breastfeeding (the first 4-6 weeks after your baby is born). After this time you may choose to use a pacifier. Research has shown that pacifier use during the first year of a baby's life decreases the risk of sudden infant death syndrome (SIDS). Allow your baby to feed on each breast as long as he or she wants. Breastfeed until your baby is finished feeding. When your baby unlatches or falls asleep while feeding from the first breast, offer the second breast. Because newborns are often sleepy in the first few weeks of life, you may need to awaken your baby to get him or her to feed. Breastfeeding times will vary from baby to baby. However, the following rules can serve as a guide to help you ensure that your baby is properly fed:  Newborns (babies 24 weeks of age or younger) may breastfeed every 1-3 hours.  Newborns should not go longer than 3 hours during the day or 5 hours during the night without breastfeeding.  You should breastfeed your baby a minimum of 8 times in a 24-hour period until you begin to introduce solid foods to your baby at around 44 months of age. BREAST MILK PUMPING Pumping and storing breast milk allows you to ensure that your baby is  exclusively fed your breast milk, even at times when you are unable to breastfeed. This is especially important if you are going back to work while you are still breastfeeding or when you are not able to be present during feedings. Your lactation consultant can give you guidelines on how long it is safe to store breast milk. A breast pump is a machine that allows you to pump milk from your breast into a sterile bottle. The pumped breast milk can then be stored in a refrigerator or freezer. Some breast pumps are operated by hand, while others use electricity. Ask your lactation consultant which type will work best for you. Breast pumps can be purchased, but some hospitals and breastfeeding support groups lease breast pumps on a monthly basis. A lactation consultant can teach you how to hand express breast milk, if you prefer not to use a pump. CARING FOR YOUR BREASTS WHILE YOU BREASTFEED Nipples can become dry, cracked, and sore while breastfeeding. The following recommendations can help keep your breasts moisturized and healthy:  Avoid using soap on your nipples.  Wear a supportive bra. Although not required, special nursing bras and tank tops are designed to allow access to your breasts for breastfeeding without taking off your entire bra or top. Avoid wearing underwire-style bras or extremely tight bras.  Air dry your nipples for 3-59minutes after each feeding.  Use only cotton bra pads to absorb leaked breast milk. Leaking of breast milk between feedings is normal.  Use lanolin on your nipples after breastfeeding. Lanolin helps to maintain your skin's normal moisture barrier. If you use pure lanolin, you do not need to wash it off before feeding your baby again. Pure lanolin is not toxic to your baby. You may also hand express a few drops of breast milk  and gently massage that milk into your nipples and allow the milk to air dry. In the first few weeks after giving birth, some women experience  extremely full breasts (engorgement). Engorgement can make your breasts feel heavy, warm, and tender to the touch. Engorgement peaks within 3-5 days after you give birth. The following recommendations can help ease engorgement:  Completely empty your breasts while breastfeeding or pumping. You may want to start by applying warm, moist heat (in the shower or with warm water-soaked hand towels) just before feeding or pumping. This increases circulation and helps the milk flow. If your baby does not completely empty your breasts while breastfeeding, pump any extra milk after he or she is finished.  Wear a snug bra (nursing or regular) or tank top for 1-2 days to signal your body to slightly decrease milk production.  Apply ice packs to your breasts, unless this is too uncomfortable for you.  Make sure that your baby is latched on and positioned properly while breastfeeding. If engorgement persists after 48 hours of following these recommendations, contact your health care provider or a Advertising copywriter. OVERALL HEALTH CARE RECOMMENDATIONS WHILE BREASTFEEDING  Eat healthy foods. Alternate between meals and snacks, eating 3 of each per day. Because what you eat affects your breast milk, some of the foods may make your baby more irritable than usual. Avoid eating these foods if you are sure that they are negatively affecting your baby.  Drink milk, fruit juice, and water to satisfy your thirst (about 10 glasses a day).  Rest often, relax, and continue to take your prenatal vitamins to prevent fatigue, stress, and anemia.  Continue breast self-awareness checks.  Avoid chewing and smoking tobacco. Chemicals from cigarettes that pass into breast milk and exposure to secondhand smoke may harm your baby.  Avoid alcohol and drug use, including marijuana. Some medicines that may be harmful to your baby can pass through breast milk. It is important to ask your health care provider before taking any  medicine, including all over-the-counter and prescription medicine as well as vitamin and herbal supplements. It is possible to become pregnant while breastfeeding. If birth control is desired, ask your health care provider about options that will be safe for your baby. SEEK MEDICAL CARE IF:  You feel like you want to stop breastfeeding or have become frustrated with breastfeeding.  You have painful breasts or nipples.  Your nipples are cracked or bleeding.  Your breasts are red, tender, or warm.  You have a swollen area on either breast.  You have a fever or chills.  You have nausea or vomiting.  You have drainage other than breast milk from your nipples.  Your breasts do not become full before feedings by the fifth day after you give birth.  You feel sad and depressed.  Your baby is too sleepy to eat well.  Your baby is having trouble sleeping.   Your baby is wetting less than 3 diapers in a 24-hour period.  Your baby has less than 3 stools in a 24-hour period.  Your baby's skin or the white part of his or her eyes becomes yellow.   Your baby is not gaining weight by 17 days of age. SEEK IMMEDIATE MEDICAL CARE IF:  Your baby is overly tired (lethargic) and does not want to wake up and feed.  Your baby develops an unexplained fever.   This information is not intended to replace advice given to you by your health care provider. Make sure  you discuss any questions you have with your health care provider.   Document Released: 10/03/2005 Document Revised: 06/24/2015 Document Reviewed: 03/27/2013 Elsevier Interactive Patient Education [employer]

## 2016-07-26 ENCOUNTER — Ambulatory Visit (INDEPENDENT_AMBULATORY_CARE_PROVIDER_SITE_OTHER): Payer: Managed Care, Other (non HMO) | Admitting: Family Medicine

## 2016-07-26 VITALS — BP 120/76 | HR 99 | Temp 98.4°F | Resp 16 | Ht 62.0 in | Wt 144.0 lb

## 2016-07-26 DIAGNOSIS — R519 Headache, unspecified: Secondary | ICD-10-CM

## 2016-07-26 DIAGNOSIS — O24419 Gestational diabetes mellitus in pregnancy, unspecified control: Secondary | ICD-10-CM | POA: Diagnosis not present

## 2016-07-26 DIAGNOSIS — Z83438 Family history of other disorder of lipoprotein metabolism and other lipidemia: Secondary | ICD-10-CM

## 2016-07-26 DIAGNOSIS — R51 Headache: Secondary | ICD-10-CM | POA: Diagnosis not present

## 2016-07-26 DIAGNOSIS — Z8349 Family history of other endocrine, nutritional and metabolic diseases: Secondary | ICD-10-CM

## 2016-07-26 DIAGNOSIS — R5383 Other fatigue: Secondary | ICD-10-CM

## 2016-07-26 LAB — LIPID PANEL
CHOL/HDL RATIO: 2.9 ratio (ref <=5.0)
CHOLESTEROL: 253 mg/dL — AB (ref 125–200)
HDL: 87 mg/dL (ref >=46)
LDL Cholesterol: 149 mg/dL — ABNORMAL HIGH (ref <130)
Triglycerides: 87 mg/dL (ref <150)
VLDL: 17 mg/dL (ref <30)

## 2016-07-26 LAB — CBC
HEMATOCRIT: 41.3 % (ref 35.0–45.0)
HEMOGLOBIN: 14.2 g/dL (ref 11.7–15.5)
MCH: 29.5 pg (ref 27.0–33.0)
MCHC: 34.4 g/dL (ref 32.0–36.0)
MCV: 85.7 fL (ref 80.0–100.0)
MPV: 9.4 fL (ref 7.5–12.5)
Platelets: 241 10*3/uL (ref 140–400)
RBC: 4.82 MIL/uL (ref 3.80–5.10)
RDW: 15.4 % — AB (ref 11.0–15.0)
WBC: 6.3 10*3/uL (ref 3.8–10.8)

## 2016-07-26 LAB — POCT GLYCOSYLATED HEMOGLOBIN (HGB A1C): Hemoglobin A1C: 5.3

## 2016-07-26 LAB — GLUCOSE, POCT (MANUAL RESULT ENTRY): POC Glucose: 89 mg/dl (ref 70–99)

## 2016-07-26 MED ORDER — DICLOFENAC SODIUM 75 MG PO TBEC: 75.0000 mg | DELAYED_RELEASE_TABLET | Freq: Two times a day (BID) | ORAL | 3 refills | Status: DC

## 2016-07-26 NOTE — Progress Notes (Signed)
Subjective:  By signing my name below, I, Stann Oresung-Kai Tsai, attest that this documentation has been prepared under the direction and in the presence of Norberto SorensonEva Nora Rooke, MD. Electronically Signed: Stann Oresung-Kai Tsai, Scribe. 07/26/2016 , 12:33 PM .  Patient was seen in Room 12 .   Patient ID: Dominique Powers, female    DOB: October 01, 1981, 35 y.o.   MRN: [id number] Chief Complaint  Patient presents with  . diabetes check   HPI Dominique Powers is a 35 y.o. female who [facility] to [facility] for diabetic check. She has a h/o gestational diabetes. Over the past few weeks, she's been "feeling more blah". Her appetite has returned to normal, and she's been feeling more thirsty. She is currently breast feeding. She hasn't taken medication for this, as she's only been controlling with diet and exercise. She's been checking her sugars at home, and it's been running well. She denies any changes with her vision. Her menses hasn't restarted yet.   She mentions her headaches returning recently. She didn't have headaches while she was pregnant. She's taken advil for them with mild relief. She's taken wellbutrin in the past with some relief. She's also tried topamax but "it made her feel like a zombie".   Her mother has history of high cholesterol.  She has a 863 month old daughter named Radio producerLavender.   Past Medical History:  Diagnosis Date  . ADD (attention deficit disorder)   . GDM, class A1 04/06/2016  . Gestational diabetes mellitus (GDM), antepartum   . 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 Postpartum care following cesarean delivery (6/22) 04/07/2016   Prior to Admission medications   Medication Sig Start Date End Date Taking? Authorizing Provider  acetaminophen (TYLENOL) 500 MG tablet Take 1,000 mg by mouth every 6 (six) hours as needed for moderate pain or headache.    Yes Historical Provider, MD  ibuprofen (ADVIL,MOTRIN) 600 MG tablet Take 1 tablet (600 mg total) by mouth every 6 (six) hours. 04/09/16  Yes Raelyn Moraolitta Dawson, CNM    loratadine (CLARITIN) 10 MG tablet Take 1 tablet (10 mg total) by mouth daily. 10/07/15  Yes Dorna LeitzNicole V Bush, PA-C  Prenatal Vit-Fe Fumarate-FA (PRENATAL VITAMINS PLUS) 27-1 MG TABS Take 1 tablet by mouth daily.   Yes Historical Provider, MD   Allergies  Allergen Reactions  . Amoxicillin Anaphylaxis    Has patient had a PCN reaction causing immediate rash, facial/tongue/throat swelling, SOB or lightheadedness with hypotension: Yes Has patient had a PCN reaction causing severe rash involving mucus membranes or skin necrosis: No Has patient had a PCN reaction that required hospitalization Yes Has patient had a PCN reaction occurring within the last 10 years: No If all of the above answers are "NO", then may proceed with Cephalosporin use.    Review of Systems  Constitutional: Positive for fatigue. Negative for appetite change and unexpected weight change.  Eyes: Negative for visual disturbance.  Respiratory: Negative for chest tightness and shortness of breath.   Cardiovascular: Negative for chest pain, palpitations and leg swelling.  Gastrointestinal: Negative for abdominal pain and blood in stool.  Endocrine: Positive for polydipsia.  Neurological: Positive for headaches. Negative for dizziness, syncope and light-headedness.       Objective:   Physical Exam  Constitutional: She is oriented to person, place, and time. She appears well-developed and well-nourished. No distress.  HENT:  Head: Normocephalic and atraumatic.  Eyes: EOM are normal. Pupils are equal, round, and reactive to light.  Neck: Neck supple. No thyromegaly present.  Cardiovascular:  Normal rate.   Pulmonary/Chest: Effort normal. No respiratory distress.  Musculoskeletal: Normal range of motion.  Neurological: She is alert and oriented to person, place, and time.  Skin: Skin is warm and dry.  Psychiatric: She has a normal mood and affect. Her behavior is normal.  Nursing note and vitals reviewed.   BP 120/76 (BP  Location: Right Arm, Patient Position: Sitting, Cuff Size: Normal)   Pulse 99   Temp 98.4 F (36.9 C) (Oral)   Resp 16   Ht 5\' 2"  (1.575 m)   Wt 144 lb (65.3 kg)   SpO2 98%   BMI 26.34 kg/m     Assessment & Plan:   1. Gestational diabetes mellitus (GDM), antepartum, gestational diabetes method of control unspecified - resolved  2. Family history of hyperlipidemia   3. Fatigue, unspecified type   4. Chronic daily headache - will need trial of preventative med in future though pt has tried and flaired number - does not want to start on medication now though since breast feeding, cons neurology referral    Orders Placed This Encounter  Procedures  . Fructosamine  . Lipid panel    Order Specific Question:   Has the patient fasted?    Answer:   Yes  . Thyroid Panel With TSH  . CBC  . POCT glycosylated hemoglobin (Hb A1C)  . POCT glucose (manual entry)    Meds ordered this encounter  Medications  . diclofenac (VOLTAREN) 75 MG EC tablet    Sig: Take 1 tablet (75 mg total) by mouth 2 (two) times daily.    Dispense:  60 tablet    Refill:  3    I personally performed the services described in this documentation, which was scribed in my presence. The recorded information has been reviewed and considered, and addended by me as needed.   Norberto Sorenson, M.D.  [facility] [address] [place], [place] [address] [phone] phone [phone] fax  08/16/16 11:20 PM  Results for orders placed or performed in visit on 07/26/16  Fructosamine  Result Value Ref Range   Fructosamine 253 190 - 270 umol/L  Lipid panel  Result Value Ref Range   Cholesterol 253 (H) 125 - 200 mg/dL   Triglycerides 87 <130 mg/dL   HDL 87 >=86 mg/dL   Total CHOL/HDL Ratio 2.9 <=5.0 Ratio   VLDL 17 <30 mg/dL   LDL Cholesterol 578 (H) <130 mg/dL  Thyroid Panel With TSH  Result Value Ref Range   T4, Total 6.4 4.5 - 12.0 ug/dL   T3 Uptake 31 22 - 35 %   Free Thyroxine  Index 2.0 1.4 - 3.8   TSH 1.71 mIU/L  CBC  Result Value Ref Range   WBC 6.3 3.8 - 10.8 K/uL   RBC 4.82 3.80 - 5.10 MIL/uL   Hemoglobin 14.2 11.7 - 15.5 g/dL   HCT 46.9 62.9 - 52.8 %   MCV 85.7 80.0 - 100.0 fL   MCH 29.5 27.0 - 33.0 pg   MCHC 34.4 32.0 - 36.0 g/dL   RDW 41.3 (H) 24.4 - 01.0 %   Platelets 241 140 - 400 K/uL   MPV 9.4 7.5 - 12.5 fL  POCT glycosylated hemoglobin (Hb A1C)  Result Value Ref Range   Hemoglobin A1C 5.3   POCT glucose (manual entry)  Result Value Ref Range   POC Glucose 89 70 - 99 mg/dl

## 2016-07-27 LAB — THYROID PANEL WITH TSH
Free Thyroxine Index: 2 (ref 1.4–3.8)
T3 Uptake: 31 % (ref 22–35)
T4, Total: 6.4 ug/dL (ref 4.5–12.0)
TSH: 1.71 mIU/L

## 2016-07-28 LAB — FRUCTOSAMINE: FRUCTOSAMINE: 253 umol/L (ref 190–270)

## 2016-08-03 ENCOUNTER — Encounter: Payer: Self-pay | Admitting: Family Medicine

## 2016-08-03 ENCOUNTER — Telehealth: Payer: Self-pay

## 2016-08-03 NOTE — Telephone Encounter (Signed)
Form in your box in dr lounge

## 2016-08-03 NOTE — Telephone Encounter (Signed)
Pt dropped off pe form to be completed by dr Sylvie Farriershaw     Best phone is [phone]

## 2016-08-03 NOTE — Telephone Encounter (Signed)
Signed and returned - sheketia was completing to fax back

## 2016-08-04 NOTE — Telephone Encounter (Signed)
sheketia advised me that she got this form faxed w/confirmation and left copy for pt at front desk. She also called pt to advise.

## 2016-11-13 NOTE — Progress Notes (Signed)
Subjective:    Patient ID: Dominique Powers, female    DOB: 24-Aug-1981, 36 y.o.   MRN: [id number] Chief Complaint  Patient presents with  . Depression    gotten worse of last few months   . Headache    past 2-3 weeks, every day     HPI  [name] is a delightful 36 yo woman who is here to review her chronic medical problems.  She has struggled with chronic headaches, ADD, and depression for many years and has failed numerous meds.  She had a daughter Tresa ResLavender on 04/07/16 and went off all medications during her pregnancy. She did have class A1 diet controlled gestational DM.  She had a recurrence of some of her prior mood and HA but as she was breastfeeding she chose to defer treatment  Mood sxs/ADD: Wellbutrin prior with some relief. Was prev on adderall and vyvanse - vyvanse helped but adderall made her HAs worse.    Family Planning;  Chronic daily HAs: Used advil with mild rellief but gets into the analgesic rebound pattern.  Topamax made her "feel like a zombie."  Has tried and failed a number of preventative meds prior.  We tried diclofenac to treat rather than advil. Migraine: propranolol and/or amitriptyline best, valproate (not good for women of child-bearing potentional, gabapentin,  Atenolol, verapamil, atenolol, nadolol, metoprolol, timolol), mag, riboflavin, butterbur, candesartan,  nortriptyline, protriptyline; last resort tizanidine, feverfew, memantine, pregabalin, cyproheptadine, zonisamide,  Daily tension: TCA (amitryptiline) best, venlafaxine, gabapentin, tizanidine Chronic cluster: verapamil best, lithium,  Hemicrania: indomethacin Hypnic HA: caffiene qhs, indomethacin, lithium Primary stabbing HA: melatonin, indomethacin  Gest DM: a1c was 5.3 FHx HPL:    Is noticing much more anxiety.  No panic attacks. Worse whe leaves the house.  Sleeping well. Does feel some underlying sinus.  Gets really anxious about thoughts of going back to work. Doesn't feel like herself.  Is  exercising, walking, Doing wel on a schedule.  Variable sleep with Lavender. Still breastfeeding full time - just started solid last mo. For the past 2-3 wks she is getting HAs daily.  Initially the diclofenac helped alittle but nnow not so much. HAs tend to occur more in the afternoon but not to much consistentcy.  Tend to be dull and med might improve in a few hours. No sharp.  All over the top of her head.    No vision changes, not a lot of screen time. Occ lightheadedness for a few seconds in the past 6 mos when HAs. No n/v, no photophobia/phonophobia.  No aura. No vision change.  Drinks 2 cups of coffee. Helps a little with the HAs.  She decreased sugar intake without benefit and drinks a lot of water.  Never tried ssri as worried about potential side effects. wellbutrin did help a little but caused some more memory problems. 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 Tylenol ES.   cbd oil rec by sister Mom was on paxil for years Younger sister doing well on celexa for the psat sev mos.  Past Medical History:  Diagnosis Date  . ADD (attention deficit disorder)   . GDM, class A1 04/06/2016  . Gestational diabetes mellitus (GDM), antepartum   . 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 Postpartum care following cesarean delivery (6/22) 04/07/2016   Past Surgical History:  Procedure Laterality Date  . CESAREAN SECTION N/A 04/07/2016   Procedure: CESAREAN SECTION;  Surgeon: Shea EvansVaishali Mody, MD;  [facility];  Service: Obstetrics;  Laterality: N/A;  . MOLE REMOVAL    . WISDOM TOOTH EXTRACTION     Current Outpatient Prescriptions on File Prior to Visit  Medication Sig Dispense Refill  . acetaminophen (TYLENOL) 500 MG tablet Take 1,000 mg by mouth every 6 (six) hours as needed for moderate pain or headache.     . ibuprofen (ADVIL,MOTRIN) 600 MG tablet Take 1 tablet (600 mg total) by mouth every 6 (six) hours. 30 tablet 0  . loratadine (CLARITIN) 10  MG tablet Take 1 tablet (10 mg total) by mouth daily. 30 tablet 11  . Prenatal Vit-Fe Fumarate-FA (PRENATAL VITAMINS PLUS) 27-1 MG TABS Take 1 tablet by mouth daily.     No current facility-administered medications on file prior to visit.    Allergies  Allergen Reactions  . Amoxicillin Anaphylaxis    Has patient had a PCN reaction causing immediate rash, facial/tongue/throat swelling, SOB or lightheadedness with hypotension: Yes Has patient had a PCN reaction causing severe rash involving mucus membranes or skin necrosis: No Has patient had a PCN reaction that required hospitalization Yes Has patient had a PCN reaction occurring within the last 10 years: No If all of the above answers are "NO", then may proceed with Cephalosporin use.    Family History  Problem Relation Age of Onset  . Hyperlipidemia Mother   . 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 Hemophilia Cousin     maternal female cousin  . Hemophilia Cousin     maternal female cousin  . Diabetes Maternal Grandmother    Social History   Social History  . Marital status: Married    Spouse name: N/A  . Number of children: N/A  . Years of education: N/A   Social History Main Topics  . Smoking status: Never Smoker  . Smokeless tobacco: Never Used  . Alcohol use No     Comment: occasional   . Drug use: No  . Sexual activity: Yes   Other Topics Concern  . None   Social History Narrative  . None   Depression screen [facility] 2/9 11/14/2016 07/26/2016 01/27/2016 11/11/2015 10/22/2015  Decreased Interest 0 0 0 0 0  Down, Depressed, Hopeless 3 0 0 0 0  PHQ - 2 Score 3 0 0 0 0  Altered sleeping 0 - - - -  Tired, decreased energy 3 - - - -  Change in appetite 3 - - - -  Feeling bad or failure about yourself  1 - - - -  Trouble concentrating 3 - - - -  Moving slowly or fidgety/restless 0 - - - -  Suicidal thoughts 0 - - - -  PHQ-9 Score 13 - - - -  Difficult doing work/chores Not difficult at all - - - -    Review of  Systems See hpi    Objective:   Physical Exam  Constitutional: She is oriented to person, place, and time. She appears well-developed and well-nourished. No distress.  HENT:  Head: Normocephalic and atraumatic.  Right Ear: External ear normal.  Left Ear: External ear normal.  Eyes: Conjunctivae are normal. No scleral icterus.  Neck: Normal range of motion. Neck supple. No thyromegaly present.  Cardiovascular: Normal rate, regular rhythm, normal heart sounds and intact distal pulses.   Pulmonary/Chest: Effort normal and breath sounds normal. No respiratory distress.  Musculoskeletal: She exhibits no edema.  Lymphadenopathy:    She has no cervical adenopathy.  Neurological: She is alert  and oriented to person, place, and time.  Skin: Skin is warm and dry. She is not diaphoretic. No erythema.  Psychiatric: She has a normal mood and affect. Her behavior is normal.     BP (!) 143/87 (BP Location: Right Arm, Patient Position: Sitting, Cuff Size: Small)   Pulse (!) 112   Temp 97.8 F (36.6 C) (Oral)   Resp 16   Ht 5\' 2"  (1.575 m)   Wt 150 lb 3.2 oz (68.1 kg)   LMP 10/17/2016   SpO2 98%   BMI 27.47 kg/m   Assessment & Plan:   1. Chronic daily headache   Cont to alternate tylenol with nsaid to avoid analgesic rebound HA. Try indocin - see if more effective than ibuprofen.  Wants to ensure she avoids any medication with addictive potential - requests to stay away from fioricet with codeine for this reason (has used prior) - but willing to try fiorinal - will watch refill pattern on behalf of pt to ensure doesn't exceed expected freq for PRN - not reg - use.  Will want to retry topamax or other preventative med when she is no longer breastfeeding though has failed almost everything prior.  Consider specialty referral.  2.   Mood d/o Reviewed all the risks/benefits and mult options - pt mostly concerned that she is still breastfeeding and so really wants to make sure she minimizes risk of  transmission to her 6 mo daughter Lavender. Looks like Zoloft has the lowest transmission into breast milk so will try (though effexor would have the best effect for chronic HA prevention).   Meds ordered this encounter  Medications  . sertraline (ZOLOFT) 25 MG tablet    Sig: Take 1 tablet (25 mg total) by mouth daily.    Dispense:  30 tablet    Refill:  1  . indomethacin (INDOCIN) 50 MG capsule    Sig: Take 1 capsule (50 mg total) by mouth 2 (two) times daily with a meal.    Dispense:  60 capsule    Refill:  1  . butalbital-aspirin-caffeine (FIORINAL) 50-325-40 MG capsule    Sig: Take 1 capsule by mouth every 6 (six) hours as needed for headache.    Dispense:  20 capsule    Refill:  1   Over 40 min spent in face-to-face evaluation of and consultation with patient and coordination of care.  Over 50% of this time was spent counseling this patient.   Norberto Sorenson, M.D.  Primary Care at [facility] [address] [place], [place] [address] [phone] phone [phone] fax  12/04/16 2:46 AM

## 2016-11-14 ENCOUNTER — Encounter: Payer: Self-pay | Admitting: Family Medicine

## 2016-11-14 ENCOUNTER — Ambulatory Visit (INDEPENDENT_AMBULATORY_CARE_PROVIDER_SITE_OTHER): Payer: Commercial Managed Care - PPO | Admitting: Family Medicine

## 2016-11-14 VITALS — BP 143/87 | HR 112 | Temp 97.8°F | Resp 16 | Ht 62.0 in | Wt 150.2 lb

## 2016-11-14 DIAGNOSIS — R51 Headache: Secondary | ICD-10-CM | POA: Diagnosis not present

## 2016-11-14 DIAGNOSIS — F39 Unspecified mood [affective] disorder: Secondary | ICD-10-CM | POA: Diagnosis not present

## 2016-11-14 DIAGNOSIS — R519 Headache, unspecified: Secondary | ICD-10-CM

## 2016-11-14 MED ORDER — SERTRALINE HCL 25 MG PO TABS: 25.0000 mg | ORAL_TABLET | Freq: Every day | ORAL | 1 refills | Status: DC

## 2016-11-14 MED ORDER — BUTALBITAL-ASPIRIN-CAFFEINE 50-325-40 MG PO CAPS: 1.0000 | ORAL_CAPSULE | Freq: Four times a day (QID) | ORAL | 1 refills | Status: DC | PRN

## 2016-11-14 MED ORDER — INDOMETHACIN 50 MG PO CAPS: 50.0000 mg | ORAL_CAPSULE | Freq: Two times a day (BID) | ORAL | 1 refills | Status: DC

## 2016-11-14 NOTE — Patient Instructions (Addendum)
IF you received an x-ray today, you will receive an invoice from [facility]. Please contact [facility] at [phone] with questions or concerns regarding your invoice.   IF you received labwork today, you will receive an invoice from [facility]. Please contact [facility] at [phone] with questions or concerns regarding your invoice.   Our billing staff will not be able to assist you with questions regarding bills from these companies.  You will be contacted with the lab results as soon as they are available. The fastest way to get your results is to activate your My Chart account. Instructions are located on the last page of this paperwork. If you have not heard from us regarding the results in 2 weeks, please contact this office.      Analgesic Rebound Headache An analgesic rebound headache, sometimes called a medication overuse headache, is a headache that comes after pain medicine (analgesic) taken to treat the original (primary) headache has worn off. Any type of primary headache can return as a rebound headache if a person regularly takes analgesics more than three times a week to treat it. The types of primary headaches that are commonly associated with rebound headaches include:  Migraines.  Headaches that arise from tense muscles in the head and neck area (tension headaches).  Headaches that develop and happen again (recur) on one side of the head and around the eye (cluster headaches). If rebound headaches continue, they become chronic daily headaches. What are the causes? This condition may be caused by frequent use of:  Over-the-counter medicines such as aspirin, ibuprofen, and acetaminophen.  Sinus relief medicines and other medicines that contain caffeine.  Narcotic pain medicines such as codeine and oxycodone. What are the signs or symptoms? The symptoms of a rebound headache are the same as the symptoms of the original headache. Some of the  symptoms of specific types of headaches include: Migraine headache  Pulsing or throbbing pain on one or both sides of the head.  Severe pain that interferes with daily activities.  Pain that is worsened by physical activity.  Nausea, vomiting, or both.  Pain with exposure to bright light, loud noises, or strong smells.  General sensitivity to bright light, loud noises, or strong smells.  Visual changes.  Numbness of one or both arms. Tension headache  Pressure around the head.  Dull, aching head pain.  Pain felt over the front and sides of the head.  Tenderness in the muscles of the head, neck, and shoulders. Cluster headache  Severe pain that begins in or around one eye or temple.  Redness and tearing in the eye on the same side as the pain.  Droopy or swollen eyelid.  One-sided head pain.  Nausea.  Runny nose.  Sweaty, pale facial skin.  Restlessness. How is this diagnosed? This condition is diagnosed by:  Reviewing your medical history. This includes the nature of your primary headaches.  Reviewing the types of pain medicines that you have been using to treat your headaches and how often you take them. How is this treated? This condition may be treated or managed by:  Discontinuing frequent use of the analgesic medicine. Doing this may worsen your headaches at first, but the pain should eventually become more manageable, less frequent, and less severe.  Seeing a headache specialist. He or she may be able to help you manage your headaches and help make sure there is not another cause of the headaches.  Using methods of stress relief, such  as acupuncture, counseling, biofeedback, and massage. Talk with your health care provider about which methods might be good for you. Follow these instructions at home:  Take over-the-counter and prescription medicines only as told by your health care provider.  Stop the repeated use of pain medicine as told by your  health care provider. Stopping can be difficult. Carefully follow instructions from your health care provider.  Avoid triggers that are known to cause your primary headaches.  Keep all follow-up visits as told by your health care provider. This is important. Contact a health care provider if:  You continue to experience headaches after following treatments that your health care provider recommended. Get help right away if:  You develop new headache pain.  You develop headache pain that is different than what you have experienced in the past.  You develop numbness or tingling in your arms or legs.  You develop changes in your speech or vision. This information is not intended to replace advice given to you by your health care provider. Make sure you discuss any questions you have with your health care provider. Document Released: 12/24/2003 Document Revised: 04/22/2016 Document Reviewed: 03/07/2016 Elsevier Interactive Patient Education  2017 [employer].

## 2016-12-04 ENCOUNTER — Encounter: Payer: Self-pay | Admitting: Family Medicine

## 2016-12-08 ENCOUNTER — Telehealth: Payer: Self-pay

## 2016-12-08 NOTE — Telephone Encounter (Signed)
11/14/16 last ov Please advise

## 2016-12-08 NOTE — Telephone Encounter (Signed)
Pt is wanting to know if she is able to increase her dosage of zoloft   Best number to call is [phone]

## 2016-12-09 MED ORDER — SERTRALINE HCL 100 MG PO TABS: 50.0000 mg | ORAL_TABLET | Freq: Every day | ORAL | 1 refills | Status: DC

## 2016-12-09 NOTE — Telephone Encounter (Addendum)
Yes, can absolutely go up to 50mg . Will call pt. Pre-existing headaches Anxiety worse post-partum - wondeirng if copper IUD could be in play.  Noticed a 5% improvement in mood with zoloft. No side effects, no change in HAs. Anxiety unchanged. She will increase zoloft to 50mg  and plan to increase to 100mg  in 2 wks if still no side effects but not yet reached ideal response.

## 2017-01-10 ENCOUNTER — Other Ambulatory Visit: Payer: Self-pay | Admitting: Family Medicine

## 2017-01-13 ENCOUNTER — Other Ambulatory Visit: Payer: Self-pay | Admitting: Family Medicine

## 2017-01-14 NOTE — Telephone Encounter (Signed)
Please call this into the pharmacy #20 with 1 refill. I printed it off to fax on 3/29 Thurs at 104 so not sure why it isn't here yet

## 2017-01-16 NOTE — Telephone Encounter (Signed)
Called in.

## 2017-02-17 ENCOUNTER — Other Ambulatory Visit: Payer: Self-pay | Admitting: Family Medicine

## 2017-02-17 MED ORDER — SERTRALINE HCL 100 MG PO TABS: 100.0000 mg | ORAL_TABLET | Freq: Every day | ORAL | 1 refills | Status: DC

## 2017-02-20 ENCOUNTER — Ambulatory Visit (INDEPENDENT_AMBULATORY_CARE_PROVIDER_SITE_OTHER): Payer: Commercial Managed Care - PPO | Admitting: Family Medicine

## 2017-02-20 ENCOUNTER — Encounter: Payer: Self-pay | Admitting: Family Medicine

## 2017-02-20 VITALS — BP 136/88 | HR 112 | Temp 97.6°F | Resp 17 | Ht 62.0 in | Wt 147.0 lb

## 2017-02-20 DIAGNOSIS — H9202 Otalgia, left ear: Secondary | ICD-10-CM | POA: Diagnosis not present

## 2017-02-20 MED ORDER — CIPROFLOXACIN-DEXAMETHASONE 0.3-0.1 % OT SUSP: 4.0000 [drp] | Freq: Two times a day (BID) | OTIC | 0 refills | Status: DC

## 2017-02-20 NOTE — Patient Instructions (Addendum)
Thank you for coming in,   Please let me know if you are not getting any better by later this week.    Please feel free to call with any questions or concerns at any time, at [phone]. --Dr. Jordan LikesSchmitz     IF you received an x-ray today, you will receive an invoice from [facility]. Please contact [facility] at [phone] with questions or concerns regarding your invoice.   IF you received labwork today, you will receive an invoice from [facility]. Please contact [facility] at [phone] with questions or concerns regarding your invoice.   Our billing staff will not be able to assist you with questions regarding bills from these companies.  You will be contacted with the lab results as soon as they are available. The fastest way to get your results is to activate your My Chart account. Instructions are located on the last page of this paperwork. If you have not heard from us regarding the results in 2 weeks, please contact this office.

## 2017-02-20 NOTE — Progress Notes (Signed)
  Dominique Powers - 36 y.o. female MRN [id number]  Date of birth: 1981/05/29  SUBJECTIVE:  Including CC & ROS.  Chief Complaint  Patient presents with  . Sinusitis    onset 1 week  . Ear Pain    onset last night   Dominique Powers is a 36 yo F that is presenting with ear pain and sinus congestion. Symptoms have been occurring for about 10 days. Has been taking tylenol. She is breast feeding currently. Felt like she may had a fever yesterday. Her daughter has been sick recently. [place] No new pets. Has seasonal allergies.   ROS: No unexpected weight loss, fever, chills, swelling, instability, muscle pain, numbness/tingling, redness, otherwise see HPI   HISTORY: Past Medical, Surgical, Social, and Family History Reviewed & Updated per EMR.   Pertinent Historical Findings include: PMHx - GDM    PHYSICAL EXAM:  VS: BP 136/88 (BP Location: Right Arm, Patient Position: Sitting, Cuff Size: Normal)   Pulse (!) 112   Temp 97.6 F (36.4 C) (Oral)   Resp 17   Ht 5\' 2"  (1.575 m)   Wt 147 lb (66.7 kg)   LMP 02/12/2017   SpO2 95%   BMI 26.89 kg/m  PHYSICAL EXAM: Gen: NAD, alert, cooperative with exam,  HEENT: NCAT, EOMI, clear conjunctiva, oropharynx clear, supple neck, auditory canal of left ear is inflamed but TM not bulging and intact, no TTP of the tragus on left ear. Right ear and canal normal in appearance. No frontal or maxillary sinus tenderness, no tonsillar exudates  CV: regular rhythm, tachycardic, good S1/S2, no murmur, no edema, capillary refill brisk  Resp: CTABL, no wheezes, non-labored Abd: SNTND, BS present, no guarding or organomegaly Skin: no rashes, normal turgor  Neuro: no gross deficits.  Psych: alert and oriented   ASSESSMENT & PLAN:   Left ear pain It appears that she has otitis externa. Seems to have an underlying viral illness. Doesn't appear to be a sinus infection.  - ciprodex otic drops  - advised to call back in 3-4 days. If no improvement may need  to send an ABX in for otitis media vs bacterial sinusitis.  - f/u PRN

## 2017-02-20 NOTE — Assessment & Plan Note (Signed)
It appears that she has otitis externa. Seems to have an underlying viral illness. Doesn't appear to be a sinus infection.  - ciprodex otic drops  - advised to call back in 3-4 days. If no improvement may need to send an ABX in for otitis media vs bacterial sinusitis.  - f/u PRN

## 2017-02-22 ENCOUNTER — Telehealth: Payer: Self-pay | Admitting: Family Medicine

## 2017-02-22 MED ORDER — ONDANSETRON 8 MG PO TBDP: 8.0000 mg | ORAL_TABLET | Freq: Three times a day (TID) | ORAL | 0 refills | Status: DC | PRN

## 2017-02-22 MED ORDER — AZITHROMYCIN 250 MG PO TABS: ORAL_TABLET | ORAL | 0 refills | Status: DC

## 2017-02-22 MED ORDER — PROMETHAZINE-CODEINE 6.25-10 MG/5ML PO SYRP
10.0000 mL | ORAL_SOLUTION | Freq: Four times a day (QID) | ORAL | 0 refills | Status: DC | PRN
Start: 2017-02-22 — End: 2017-03-02

## 2017-02-22 MED ORDER — BENZONATATE 200 MG PO CAPS: 200.0000 mg | ORAL_CAPSULE | Freq: Three times a day (TID) | ORAL | 0 refills | Status: DC | PRN

## 2017-02-22 MED ORDER — PSEUDOEPHEDRINE HCL ER 120 MG PO TB12: 120.0000 mg | ORAL_TABLET | Freq: Two times a day (BID) | ORAL | 1 refills | Status: DC

## 2017-02-22 NOTE — Telephone Encounter (Signed)
PT CALLING WANTING SOMETHING FOR HER COUGH AND FEVER OF102 DIZZINESS WHEN STANDING BODY ACHE HEADACHE SHE WAS HERE Monday FOR EAR PAIN AND DOESN'T FEEL WELL ENOUGH TO COME BACK INTO CLINIC WITH CHILD AND DRIVING WILL DIZZY SHE IS BREAST FEEDING STILL HAVE EAR PAIN SHE  COUGH SO MUCH SHE VOMITED LAST NIGHT COUGH KEEP HER UP AT NIGHT PLEASE SEND MEDICINE TO [facility] ON [address]

## 2017-02-23 ENCOUNTER — Encounter: Payer: Self-pay | Admitting: Family Medicine

## 2017-02-23 ENCOUNTER — Ambulatory Visit (INDEPENDENT_AMBULATORY_CARE_PROVIDER_SITE_OTHER): Payer: Commercial Managed Care - PPO | Admitting: Family Medicine

## 2017-02-23 VITALS — BP 93/59 | HR 118 | Temp 98.4°F | Resp 18 | Ht 62.28 in | Wt 146.8 lb

## 2017-02-23 DIAGNOSIS — R509 Fever, unspecified: Secondary | ICD-10-CM

## 2017-02-23 DIAGNOSIS — E860 Dehydration: Secondary | ICD-10-CM | POA: Diagnosis not present

## 2017-02-23 LAB — POC INFLUENZA A&B (BINAX/QUICKVUE)
INFLUENZA A, POC: NEGATIVE
INFLUENZA B, POC: NEGATIVE

## 2017-02-23 LAB — POCT URINALYSIS DIP (MANUAL ENTRY)
Bilirubin, UA: NEGATIVE
GLUCOSE UA: NEGATIVE mg/dL
Ketones, POC UA: NEGATIVE mg/dL
Leukocytes, UA: NEGATIVE
NITRITE UA: NEGATIVE
Protein Ur, POC: 30 mg/dL — AB
UROBILINOGEN UA: 0.2 U/dL
pH, UA: 5.5 (ref 5.0–8.0)

## 2017-02-23 LAB — POCT CBC
Granulocyte percent: 91 %G — AB (ref 37–80)
HEMATOCRIT: 33.4 % — AB (ref 37.7–47.9)
Hemoglobin: 11.8 g/dL — AB (ref 12.2–16.2)
Lymph, poc: 1.6 (ref 0.6–3.4)
MCH, POC: 30.5 pg (ref 27–31.2)
MCHC: 35.2 g/dL (ref 31.8–35.4)
MCV: 86.9 fL (ref 80–97)
MID (CBC): 0.3 (ref 0–0.9)
MPV: 7.8 fL (ref 0–99.8)
PLATELET COUNT, POC: 232 10*3/uL (ref 142–424)
POC Granulocyte: 19.4 — AB (ref 2–6.9)
POC LYMPH %: 7.4 % — AB (ref 10–50)
POC MID %: 1.6 % (ref 0–12)
RBC: 3.85 M/uL — AB (ref 4.04–5.48)
RDW, POC: 13 %
WBC: 21.3 10*3/uL — AB (ref 4.6–10.2)

## 2017-02-23 LAB — POCT SEDIMENTATION RATE: POCT SED RATE: 122 mm/h — AB (ref 0–22)

## 2017-02-23 MED ORDER — HYDROCOD POLST-CPM POLST ER 10-8 MG/5ML PO SUER: 5.0000 mL | Freq: Two times a day (BID) | ORAL | 0 refills | Status: DC | PRN

## 2017-02-23 MED ORDER — AZITHROMYCIN 250 MG PO TABS: ORAL_TABLET | ORAL | 0 refills | Status: DC

## 2017-02-23 NOTE — Progress Notes (Signed)
   Subjective:    Patient ID: Dominique Powers, female    DOB: 12-23-1980, 36 y.o.   MRN: [id number]  HPI Got ill from ill on 4/31 with cough/cold   Review of Systems     Objective:   Physical Exam       BP (!) 93/59   Pulse (!) 118   Temp 98.4 F (36.9 C) (Oral)   Resp 18   Ht 5' 2.28" (1.582 m)   Wt 146 lb 12.8 oz (66.6 kg)   LMP 02/12/2017   SpO2 95%   BMI 26.61 kg/m   Assessment & Plan:

## 2017-02-23 NOTE — Telephone Encounter (Signed)
Pt coming in for appt today at 104 at 10:30 a.m.

## 2017-02-23 NOTE — Patient Instructions (Signed)
     IF you received an x-ray today, you will receive an invoice from [facility]. Please contact [facility] at [phone] with questions or concerns regarding your invoice.   IF you received labwork today, you will receive an invoice from [facility]. Please contact [facility] at [phone] with questions or concerns regarding your invoice.   Our billing staff will not be able to assist you with questions regarding bills from these companies.  You will be contacted with the lab results as soon as they are available. The fastest way to get your results is to activate your My Chart account. Instructions are located on the last page of this paperwork. If you have not heard from us regarding the results in 2 weeks, please contact this office.     

## 2017-02-24 LAB — COMPREHENSIVE METABOLIC PANEL
ALT: 31 IU/L (ref 0–32)
AST: 22 IU/L (ref 0–40)
Albumin/Globulin Ratio: 1.2 (ref 1.2–2.2)
Albumin: 3.5 g/dL (ref 3.5–5.5)
Alkaline Phosphatase: 86 IU/L (ref 39–117)
BUN/Creatinine Ratio: 18 (ref 9–23)
BUN: 34 mg/dL — AB (ref 6–20)
Bilirubin Total: 0.3 mg/dL (ref 0.0–1.2)
CALCIUM: 8.2 mg/dL — AB (ref 8.7–10.2)
CO2: 19 mmol/L (ref 18–29)
CREATININE: 1.89 mg/dL — AB (ref 0.57–1.00)
Chloride: 94 mmol/L — ABNORMAL LOW (ref 96–106)
GFR calc Af Amer: 39 mL/min/{1.73_m2} — ABNORMAL LOW (ref >59)
GFR, EST NON AFRICAN AMERICAN: 34 mL/min/{1.73_m2} — AB (ref >59)
GLOBULIN, TOTAL: 3 g/dL (ref 1.5–4.5)
GLUCOSE: 82 mg/dL (ref 65–99)
Potassium: 4.8 mmol/L (ref 3.5–5.2)
Sodium: 133 mmol/L — ABNORMAL LOW (ref 134–144)
Total Protein: 6.5 g/dL (ref 6.0–8.5)

## 2017-02-25 ENCOUNTER — Encounter: Payer: Self-pay | Admitting: Family Medicine

## 2017-02-25 ENCOUNTER — Ambulatory Visit (INDEPENDENT_AMBULATORY_CARE_PROVIDER_SITE_OTHER): Payer: Commercial Managed Care - PPO

## 2017-02-25 ENCOUNTER — Ambulatory Visit (INDEPENDENT_AMBULATORY_CARE_PROVIDER_SITE_OTHER): Payer: Commercial Managed Care - PPO | Admitting: Family Medicine

## 2017-02-25 VITALS — BP 105/69 | HR 101 | Temp 98.7°F | Resp 16 | Ht 62.0 in | Wt 150.2 lb

## 2017-02-25 DIAGNOSIS — J189 Pneumonia, unspecified organism: Secondary | ICD-10-CM | POA: Diagnosis not present

## 2017-02-25 DIAGNOSIS — E860 Dehydration: Secondary | ICD-10-CM | POA: Diagnosis not present

## 2017-02-25 DIAGNOSIS — N179 Acute kidney failure, unspecified: Secondary | ICD-10-CM

## 2017-02-25 LAB — POCT CBC
Granulocyte percent: 84.9 %G — AB (ref 37–80)
HEMATOCRIT: 34.1 % — AB (ref 37.7–47.9)
HEMOGLOBIN: 11.5 g/dL — AB (ref 12.2–16.2)
LYMPH, POC: 1.6 (ref 0.6–3.4)
MCH: 29.3 pg (ref 27–31.2)
MCHC: 33.8 g/dL (ref 31.8–35.4)
MCV: 86.7 fL (ref 80–97)
MID (CBC): 1 — AB (ref 0–0.9)
MPV: 7.2 fL (ref 0–99.8)
POC GRANULOCYTE: 14.9 — AB (ref 2–6.9)
POC LYMPH PERCENT: 9.2 %L — AB (ref 10–50)
POC MID %: 5.9 % (ref 0–12)
Platelet Count, POC: 284 10*3/uL (ref 142–424)
RBC: 3.94 M/uL — AB (ref 4.04–5.48)
RDW, POC: 13.6 %
WBC: 17.6 10*3/uL — AB (ref 4.6–10.2)

## 2017-02-25 LAB — POCT SEDIMENTATION RATE: POCT SED RATE: 129 mm/hr — AB (ref 0–22)

## 2017-02-25 MED ORDER — LEVOFLOXACIN 750 MG PO TABS: 750.0000 mg | ORAL_TABLET | Freq: Every day | ORAL | 0 refills | Status: DC

## 2017-02-25 MED ORDER — ALBUTEROL SULFATE (2.5 MG/3ML) 0.083% IN NEBU
2.5000 mg | INHALATION_SOLUTION | Freq: Once | RESPIRATORY_TRACT | Status: AC
  Administered 2017-02-25: 2.5 mg via RESPIRATORY_TRACT

## 2017-02-25 NOTE — Progress Notes (Addendum)
Subjective:  By signing my name below, I, Dominique Powers, attest that this documentation has been prepared under the direction and in the presence of Dominique Sorenson, MD Electronically Signed: [name], ED Scribe 02/25/2017 at 9:46 AM.   Patient ID: Dominique Powers, female    DOB: [birth year], 36 y.o.   MRN: [id number]  Chief Complaint  Patient presents with  . Follow-up    cough, chest tightness, sob with exertion with taking antibiotics   HPI Dominique Powers is a 36 y.o. female who presents to Primary Care at [facility]. Seen 2 days ago with 2 weeks of URI symptoms, worsened 4 days ago, with chills, fevers, cough and uncontrollable vomiting. Pt was dehydrated, hypotensive, tachycardic, orthostatic, had numbness in her hands and confused thinking. Leukocytosis of 21, sed rate of 122. Acute renal failure. Creatinine of 1.89 and eFGR of 34. Pt was given 2 L of IV fluids in the office and started on azithromycin 500 mg x 3 days, Zofran and cough suppression. She has been hydrating and gradually starting to feel better.   Pt states that her appetite has returned. She states that she is still experiencing a cough and is able to cough mucus up, however it's not quite productive. She also reports experiencing night sweats every hour last night. She denies fever and vomiting since she was seen in the office 2 days ago. States Zofran every 8 hours has really improved nausea. Pt denies a h/o asthma or inhaler use.   Wonders about firm balls in her hand - occ are tender to palpation. New, unchanging. No restrictions on finger/hand ROM noted and no pain with motion, finger/hand use, jsut occ with grip.  Past Medical History:  Diagnosis Date  . ADD (attention deficit disorder)   . GDM, class A1 04/06/2016  . Gestational diabetes mellitus (GDM), antepartum   . 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 Postpartum care following cesarean delivery (6/22) 04/07/2016   Current Outpatient Prescriptions on File Prior to Visit    Medication Sig Dispense Refill  . azithromycin (ZITHROMAX) 250 MG tablet Take 2 tabs PO x 1 as instructed by physician 2 tablet 0  . benzonatate (TESSALON) 200 MG capsule Take 1 capsule (200 mg total) by mouth 3 (three) times daily as needed for cough. 30 capsule 0  . chlorpheniramine-HYDROcodone (TUSSIONEX PENNKINETIC ER) 10-8 MG/5ML SUER Take 5 mLs by mouth every 12 (twelve) hours as needed. 140 mL 0  . loratadine (CLARITIN) 10 MG tablet Take 1 tablet (10 mg total) by mouth daily. 30 tablet 11  . ondansetron (ZOFRAN-ODT) 8 MG disintegrating tablet Take 1 tablet (8 mg total) by mouth every 8 (eight) hours as needed for nausea or vomiting. 20 tablet 0  . acetaminophen (TYLENOL) 500 MG tablet Take 1,000 mg by mouth every 6 (six) hours as needed for moderate pain or headache.     . butalbital-aspirin-caffeine (FIORINAL) 50-325-40 MG capsule TAKE ONE CAPSULE BY MOUTH EVERY 6 HOURS AS NEEDED FOR HEADACHE (Patient not taking: Reported on 02/25/2017) 20 capsule 1  . ciprofloxacin-dexamethasone (CIPRODEX) otic suspension Place 4 drops into the left ear 2 (two) times daily. For a total of 7 days. (Patient not taking: Reported on 02/25/2017) 7.5 mL 0  . Prenatal Vit-Fe Fumarate-FA (PRENATAL VITAMINS PLUS) 27-1 MG TABS Take 1 tablet by mouth daily.    . promethazine-codeine (PHENERGAN WITH CODEINE) 6.25-10 MG/5ML syrup Take 10 mLs by mouth every 6 (six) hours as needed for cough. (Patient not taking: Reported on 02/25/2017) 180 mL  0  . pseudoephedrine (SUDAFED 12 HOUR) 120 MG 12 hr tablet Take 1 tablet (120 mg total) by mouth 2 (two) times daily. (Patient not taking: Reported on 02/25/2017) 28 tablet 1  . sertraline (ZOLOFT) 100 MG tablet Take 1 tablet (100 mg total) by mouth daily. (Patient not taking: Reported on 02/25/2017) 90 tablet 1   No current facility-administered medications on file prior to visit.    Allergies  Allergen Reactions  . Amoxicillin Anaphylaxis    Has patient had a PCN reaction causing  immediate rash, facial/tongue/throat swelling, SOB or lightheadedness with hypotension: Yes Has patient had a PCN reaction causing severe rash involving mucus membranes or skin necrosis: No Has patient had a PCN reaction that required hospitalization Yes Has patient had a PCN reaction occurring within the last 10 years: No If all of the above answers are "NO", then may proceed with Cephalosporin use.    Review of Systems  Constitutional: Positive for activity change, appetite change, chills, diaphoresis, fatigue and fever.  HENT: Positive for congestion, ear pain and sinus pressure. Negative for ear discharge, mouth sores, nosebleeds, rhinorrhea, sneezing, trouble swallowing and voice change.   Respiratory: Positive for cough, chest tightness and shortness of breath. Negative for wheezing.   Cardiovascular: Negative for chest pain.  Gastrointestinal: Positive for nausea. Negative for abdominal pain, constipation, diarrhea and vomiting.  Genitourinary: Positive for decreased urine volume and frequency. Negative for dysuria.  Musculoskeletal: Positive for myalgias. Negative for arthralgias, gait problem, joint swelling, neck pain and neck stiffness.  Allergic/Immunologic: Negative for immunocompromised state.  Neurological: Positive for dizziness, weakness, light-headedness and headaches. Negative for syncope.  Hematological: Positive for adenopathy.  Psychiatric/Behavioral: Positive for sleep disturbance.      Objective:   Physical Exam  Constitutional: She is oriented to person, place, and time. She appears well-developed and well-nourished. No distress.  HENT:  Head: Normocephalic and atraumatic.  Eyes: Conjunctivae and EOM are normal.  Neck: Neck supple. No tracheal deviation present.  Cardiovascular: Regular rhythm, S1 normal and S2 normal.   Pulmonary/Chest: Effort normal. No respiratory distress. She has rales.  Good air movement. L lower lobe inspiratory and expiratory rales with  bronchial breath sounds.  Musculoskeletal: Normal range of motion.       Hands: Neurological: She is alert and oriented to person, place, and time.  Skin: Skin is warm and dry.  Psychiatric: She has a normal mood and affect. Her behavior is normal.  Nursing note and vitals reviewed. BP 105/69 (BP Location: Right Arm, Patient Position: Sitting, Cuff Size: Normal)   Pulse (!) 101   Temp 98.7 F (37.1 C) (Oral)   Resp 16   Ht 5\' 2"  (1.575 m)   Wt 150 lb 3.2 oz (68.1 kg)   LMP 02/12/2017   SpO2 95%   PF 300 L/min Comment: POST ALBUTEROL NEBULIZER TREATMENT  BMI 27.47 kg/m    Pre peak flow: 300 Predicted peak flow: 420-470 No improvement in exam or sxs with neb treatment  Orthostatic VS for the past 24 hrs:  BP- Lying Pulse- Lying BP- Sitting Pulse- Sitting BP- Standing at 0 minutes Pulse- Standing at 0 minutes  02/25/17 0918 102/67 87 106/72 93 107/70 98  Orthostatic negative.  Dg Chest 2 View  Result Date: 02/25/2017 CLINICAL DATA:  36 year old female with a 2 week history of cough and congestion EXAM: CHEST  2 VIEW COMPARISON:  None. FINDINGS: Patchy airspace opacity in the lingula and left lower lobe obscuring both of the diaphragm and the  left heart on the frontal view. Findings are most concerning for bronchopneumonia. The cardiac and mediastinal contours are within normal limits in size. No pleural effusion or pneumothorax. No evidence of pulmonary edema. Upper abdominal bowel gas pattern is unremarkable. Osseous structures are intact and unremarkable for age. IMPRESSION: Lingular, and likely left lower lobe pneumonia. Electronically Signed   By: Malachy Moan M.D.   On: 02/25/2017 10:08    Results for orders placed or performed in visit on 02/25/17  POCT CBC  Result Value Ref Range   WBC 17.6 (A) 4.6 - 10.2 K/uL   Lymph, poc 1.6 0.6 - 3.4   POC LYMPH PERCENT 9.2 (A) 10 - 50 %L   MID (cbc) 1.0 (A) 0 - 0.9   POC MID % 5.9 0 - 12 %M   POC Granulocyte 14.9 (A) 2 - 6.9    Granulocyte percent 84.9 (A) 37 - 80 %G   RBC 3.94 (A) 4.04 - 5.48 M/uL   Hemoglobin 11.5 (A) 12.2 - 16.2 g/dL   HCT, POC 16.1 (A) 09.6 - 47.9 %   MCV 86.7 80 - 97 fL   MCH, POC 29.3 27 - 31.2 pg   MCHC 33.8 31.8 - 35.4 g/dL   RDW, POC 04.5 %   Platelet Count, POC 284 142 - 424 K/uL   MPV 7.2 0 - 99.8 fL  POCT SEDIMENTATION RATE  Result Value Ref Range   POCT SED RATE 129 (A) 0 - 22 mm/hr   Assessment & Plan:  May d/c Levaquin at 5 days if afebrile for 72 hours.  1. Dehydration   2. Pneumonia due to infectious organism, unspecified laterality, unspecified part of lung   3. Acute renal failure, unspecified acute renal failure type (HCC)   Recheck with me in 5d - will decide if she needs to finish the last 2d of levaquin at that time. Last dose of azithromycin 500mg  (d3/3) is today and start levaquin today. Will be able to resume breast-feeding in 5d (as will be 5d after last azithro and then wait 6 hrs after each levaquin dose) as long as last dose of Tussionex was >36-48 hrs prior (30 hr half-life) and not using codeine cough syrup for >24 hrs (she is not currently use this later as was ineffective which is why we changed to tussionex. Still keeping zofran sched for another 1-2d while still having to rehydrate, then change to prn.  Orders Placed This Encounter  Procedures  . DG Chest 2 View    Standing Status:   Future    Number of Occurrences:   1    Standing Expiration Date:   02/25/2018    Order Specific Question:   Reason for Exam (SYMPTOM  OR DIAGNOSIS REQUIRED)    Answer:   suspect LLL pneumonia    Order Specific Question:   Is the patient pregnant?    Answer:   No    Order Specific Question:   Preferred imaging location?    Answer:   External  . Basic metabolic panel    Order Specific Question:   Has the patient fasted?    Answer:   No  . Orthostatic vital signs  . Care order/instruction:    Scheduling Instructions:     Peak Flow (IF NEB IS ORDERED PLEASE DO BEFORE AND  AFTER NEB)  . POCT CBC  . POCT SEDIMENTATION RATE  . Insert peripheral IV    Meds ordered this encounter  Medications  . albuterol (PROVENTIL) (2.5  MG/3ML) 0.083% nebulizer solution 2.5 mg  . levofloxacin (LEVAQUIN) 750 MG tablet    Sig: Take 1 tablet (750 mg total) by mouth daily.    Dispense:  7 tablet    Refill:  0    I personally performed the services described in this documentation, which was scribed in my presence. The recorded information has been reviewed and considered, and addended by me as needed.   Dominique SorensonEva Darrall Strey, M.D.  [facility] at [facility]  [place] [address] [place], [place] [phone] phone [phone] fax  02/25/17 5:57 PM

## 2017-02-25 NOTE — Patient Instructions (Addendum)
   IF you received an x-ray today, you will receive an invoice from [facility]. Please contact [facility] at [phone] with questions or concerns regarding your invoice.   IF you received labwork today, you will receive an invoice from [facility]. Please contact [facility] at [phone] with questions or concerns regarding your invoice.   Our billing staff will not be able to assist you with questions regarding bills from these companies.  You will be contacted with the lab results as soon as they are available. The fastest way to get your results is to activate your My Chart account. Instructions are located on the last page of this paperwork. If you have not heard from us regarding the results in 2 weeks, please contact this office.      Community-Acquired Pneumonia, Adult Pneumonia is an infection of the lungs. There are different types of pneumonia. One type can develop while a person is in a hospital. A different type, called community-acquired pneumonia, develops in people who are not, or have not recently been, in the hospital or other health care facility. What are the causes? Pneumonia may be caused by bacteria, viruses, or funguses. Community-acquired pneumonia is often caused by Streptococcus pneumonia bacteria. These bacteria are often passed from one person to another by breathing in droplets from the cough or sneeze of an infected person. What increases the risk? The condition is more likely to develop in:  People who havechronic diseases, such as chronic obstructive pulmonary disease (COPD), asthma, congestive heart failure, cystic fibrosis, diabetes, or kidney disease.  People who haveearly-stage or late-stage HIV.  People who havesickle cell disease.  People who havehad their spleen removed (splenectomy).  People who havepoor dental hygiene.  People who havemedical conditions that increase the risk of breathing in (aspirating) secretions  their own mouth and nose.  People who havea weakened immune system (immunocompromised).  People who smoke.  People whotravel to areas where pneumonia-causing germs commonly exist.  People whoare around animal habitats or animals that have pneumonia-causing germs, including birds, bats, rabbits, cats, and farm animals. What are the signs or symptoms? Symptoms of this condition include:  Adry cough.  A wet (productive) cough.  Fever.  Sweating.  Chest pain, especially when breathing deeply or coughing.  Rapid breathing or difficulty breathing.  Shortness of breath.  Shaking chills.  Fatigue.  Muscle aches. How is this diagnosed? Your health care provider will take a medical history and perform a physical exam. You may also have other tests, including:  Imaging studies of your chest, including X-rays.  Tests to check your blood oxygen level and other blood gases.  Other tests on blood, mucus (sputum), fluid around your lungs (pleural fluid), and urine. If your pneumonia is severe, other tests may be done to identify the specific cause of your illness. How is this treated? The type of treatment that you receive depends on many factors, such as the cause of your pneumonia, the medicines you take, and other medical conditions that you have. For most adults, treatment and recovery from pneumonia may occur at home. In some cases, treatment must happen in a hospital. Treatment may include:  Antibiotic medicines, if the pneumonia was caused by bacteria.  Antiviral medicines, if the pneumonia was caused by a virus.  Medicines that are given by mouth or through an IV tube.  Oxygen.  Respiratory therapy. Although rare, treating severe pneumonia may include:  Mechanical ventilation. This is done if you are not breathing well on your   own and you cannot maintain a safe blood oxygen level.  Thoracentesis. This procedureremoves fluid around one lung or both lungs to help  you breathe better. Follow these instructions at home:  Take over-the-counter and prescription medicines only as told by your health care provider.  Only takecough medicine if you are losing sleep. Understand that cough medicine can prevent your body's natural ability to remove mucus from your lungs.  If you were prescribed an antibiotic medicine, take it as told by your health care provider. Do not stop taking the antibiotic even if you start to feel better.  Sleep in a semi-upright position at night. Try sleeping in a reclining chair, or place a few pillows under your head.  Do not use tobacco products, including cigarettes, chewing tobacco, and e-cigarettes. If you need help quitting, ask your health care provider.  Drink enough water to keep your urine clear or pale yellow. This will help to thin out mucus secretions in your lungs. How is this prevented? There are ways that you can decrease your risk of developing community-acquired pneumonia. Consider getting a pneumococcal vaccine if:  You are older than 37 years of age.  You are older than 37 years of age and are undergoing cancer treatment, have chronic lung disease, or have other medical conditions that affect your immune system. Ask your health care provider if this applies to you. There are different types and schedules of pneumococcal vaccines. Ask your health care provider which vaccination option is best for you. You may also prevent community-acquired pneumonia if you take these actions:  Get an influenza vaccine every year. Ask your health care provider which type of influenza vaccine is best for you.  Go to the dentist on a regular basis.  Wash your hands often. Use hand sanitizer if soap and water are not available. Contact a health care provider if:  You have a fever.  You are losing sleep because you cannot control your cough with cough medicine. Get help right away if:  You have worsening shortness of  breath.  You have increased chest pain.  Your sickness becomes worse, especially if you are an older adult or have a weakened immune system.  You cough up blood. This information is not intended to replace advice given to you by your health care provider. Make sure you discuss any questions you have with your health care provider. Document Released: 10/03/2005 Document Revised: 02/11/2016 Document Reviewed: 01/28/2015 Elsevier Interactive Patient Education  2017 [employer]  

## 2017-02-26 LAB — BASIC METABOLIC PANEL
BUN / CREAT RATIO: 13 (ref 9–23)
BUN: 14 mg/dL (ref 6–20)
CO2: 22 mmol/L (ref 18–29)
Calcium: 8.7 mg/dL (ref 8.7–10.2)
Chloride: 101 mmol/L (ref 96–106)
Creatinine, Ser: 1.06 mg/dL — ABNORMAL HIGH (ref 0.57–1.00)
GFR calc Af Amer: 78 mL/min/{1.73_m2} (ref >59)
GFR, EST NON AFRICAN AMERICAN: 68 mL/min/{1.73_m2} (ref >59)
GLUCOSE: 100 mg/dL — AB (ref 65–99)
POTASSIUM: 3.8 mmol/L (ref 3.5–5.2)
SODIUM: 139 mmol/L (ref 134–144)

## 2017-02-26 MED ORDER — ALBUTEROL SULFATE HFA 108 (90 BASE) MCG/ACT IN AERS: 2.0000 | INHALATION_SPRAY | RESPIRATORY_TRACT | 1 refills | Status: DC | PRN

## 2017-02-26 NOTE — Addendum Note (Signed)
Addended by: Norberto SorensonSHAW, EVA on: 02/26/2017 03:50 PM   Modules accepted: Orders

## 2017-02-28 NOTE — Progress Notes (Signed)
Subjective:    Patient ID: Dominique Powers, female    DOB: [birth year], 36 y.o.   MRN: [id number] Chief Complaint  Patient presents with  . Follow-up    Pneumonia, "better", but still coughing, dizzy    HPI  Dominique Powers is a delightful 36 yo woman here to follow-up on a community-acquired left lower lobe pneumonia that presented with pt in sepsis and acute renal failure from the severity. She was started on azithromycin 566m qd 1 wk ago - antibiotics were actually started the day prior to that but she could not keep abx down due to intractable vomiting which combined with the fever lead to severe dehydration with acute kidney injury. Pt had improvement the following day after antibiotics and IVF hydration but pneumonia then blossomed into the left lower lobe with ongoing night sweats and severe DOE. Pt had some mild improvement in leukocytosis but esr increased a little so course of azithro 5050mqd x 3d was then followed by levaquin 75028md - pt finished a 5d course of this yesterday but has additional pills so we can decide today if the course needs to be lengthened. Failed tessalong and promethazine-codeine cough syrup for cough suppression (as well as otc) so using tussionex bid with partial relief. Pt had no increase in her reduced peak flow after albuterol neb in the office and has no h/o or risk factor for RAD, no tob abuse, but did experience some acute episodes of feeling unable to take a breath and wheeze/tightness after a coughing spell which would throw her into a panic attack. One actually lasted for several minutes prompting her husband to call 911 - presumed these were mucous that she was mobilizing then plugging her airways so decided to try inhaler.  Left ear has been copmletely clogged for 10d - occ recently will hear fluid squish or bubble in it. I rec sudafed but can't tol well so try mucinex. Rec nasal steroid spray, freq nasal saline, or could even try afrin x 3d to alleviate ETD and  allow left middle ear to drain.  Resumed breastfeeding after I did a careful review of Mother's Milk book (see MyChart email.)   She is feeling very lightheaded, dizzy, presyncopal.  She is eating more. She is trying to drink a lot of fluids but her urine is still looking orange. She is rotating between pedialyte, gatorade, water. She is waking up to cough and nurse overnight but overnight she is not drinking as many fluids o/n.  SHe was worried about trying the albuterol as it made her dizzy.  She has finished 5d of levaquin.  She is using the tussionex, advil and tylenol to control cough.  Cough is non-productive.  Occ overheat, but not waking up covered in sweat. She will get very winded just walking up the stairs.   Past Medical History:  Diagnosis Date  . ADD (attention deficit disorder)   . GDM, class A1 04/06/2016  . Gestational diabetes mellitus (GDM), antepartum   . 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 Postpartum care following cesarean delivery (6/22) 04/07/2016   Past Surgical History:  Procedure Laterality Date  . CESAREAN SECTION N/A 04/07/2016   Procedure: CESAREAN SECTION;  Surgeon: VaiAzucena FallenD;  [facility];  Laterality: N/A;  . MOLE REMOVAL    . WISDOM TOOTH EXTRACTION     Current Outpatient Prescriptions on File Prior to Visit  Medication Sig Dispense Refill  . acetaminophen (TYLENOL) 500 MG tablet Take  1,000 mg by mouth every 6 (six) hours as needed for moderate pain or headache.     . butalbital-aspirin-caffeine (FIORINAL) 50-325-40 MG capsule TAKE ONE CAPSULE BY MOUTH EVERY 6 HOURS AS NEEDED FOR HEADACHE 20 capsule 1  . Prenatal Vit-Fe Fumarate-FA (PRENATAL VITAMINS PLUS) 27-1 MG TABS Take 1 tablet by mouth daily.    . sertraline (ZOLOFT) 100 MG tablet Take 1 tablet (100 mg total) by mouth daily. 90 tablet 1  . albuterol (PROVENTIL HFA;VENTOLIN HFA) 108 (90 Base) MCG/ACT inhaler Inhale 2 puffs into the lungs every 4 (four) hours as  needed for wheezing or shortness of breath (cough, shortness of breath or wheezing.). (Patient not taking: Reported on 03/02/2017) 1 Inhaler 1  . benzonatate (TESSALON) 200 MG capsule Take 1 capsule (200 mg total) by mouth 3 (three) times daily as needed for cough. (Patient not taking: Reported on 03/02/2017) 30 capsule 0  . loratadine (CLARITIN) 10 MG tablet Take 1 tablet (10 mg total) by mouth daily. (Patient not taking: Reported on 03/02/2017) 30 tablet 11  . ondansetron (ZOFRAN-ODT) 8 MG disintegrating tablet Take 1 tablet (8 mg total) by mouth every 8 (eight) hours as needed for nausea or vomiting. (Patient not taking: Reported on 03/02/2017) 20 tablet 0  . pseudoephedrine (SUDAFED 12 HOUR) 120 MG 12 hr tablet Take 1 tablet (120 mg total) by mouth 2 (two) times daily. (Patient not taking: Reported on 02/25/2017) 28 tablet 1   No current facility-administered medications on file prior to visit.    Allergies  Allergen Reactions  . Amoxicillin Anaphylaxis    Has patient had a PCN reaction causing immediate rash, facial/tongue/throat swelling, SOB or lightheadedness with hypotension: Yes Has patient had a PCN reaction causing severe rash involving mucus membranes or skin necrosis: No Has patient had a PCN reaction that required hospitalization Yes Has patient had a PCN reaction occurring within the last 10 years: No If all of the above answers are "NO", then may proceed with Cephalosporin use.    Family History  Problem Relation Age of Onset  . Hyperlipidemia Mother   . 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 Hemophilia Cousin        maternal female cousin  . Hemophilia Cousin        maternal female cousin  . Diabetes Maternal Grandmother    Social History   Social History  . Marital status: Married    Spouse name: N/A  . Number of children: N/A  . Years of education: N/A   Social History Main Topics  . Smoking status: Never Smoker  . Smokeless tobacco: Never Used  . Alcohol use  No     Comment: occasional   . Drug use: No  . Sexual activity: Yes   Other Topics Concern  . None   Social History Narrative  . None   Depression screen [facility] 2/9 03/02/2017 02/25/2017 02/23/2017 02/20/2017 11/14/2016  Decreased Interest 0 0 0 0 0  Down, Depressed, Hopeless 0 0 0 0 3  PHQ - 2 Score 0 0 0 0 3  Altered sleeping - - - - 0  Tired, decreased energy - - - - 3  Change in appetite - - - - 3  Feeling bad or failure about yourself  - - - - 1  Trouble concentrating - - - - 3  Moving slowly or fidgety/restless - - - - 0  Suicidal thoughts - - - - 0  PHQ-9 Score - - - -  13  Difficult doing work/chores - - - - Not difficult at all    Review of Systems See hpi    Objective:   Physical Exam  Constitutional: She is oriented to person, place, and time. She appears well-developed and well-nourished. She appears lethargic. She appears ill. No distress.  HENT:  Head: Normocephalic and atraumatic.  Right Ear: External ear and ear canal normal. Tympanic membrane is retracted.  Left Ear: External ear and ear canal normal. Tympanic membrane is erythematous (opacified, worst from 9-12 o'clock) and retracted.  Nose: Mucosal edema present. No rhinorrhea.  Mouth/Throat: Uvula is midline. Mucous membranes are dry. Posterior oropharyngeal erythema present. No oropharyngeal exudate, posterior oropharyngeal edema or tonsillar abscesses.  Eyes: Conjunctivae are normal. Right eye exhibits no discharge. Left eye exhibits no discharge. No scleral icterus.  Neck: Normal range of motion. Neck supple.  Cardiovascular: Normal rate, regular rhythm, normal heart sounds and intact distal pulses.   Pulmonary/Chest: Effort normal. No respiratory distress. She has no decreased breath sounds. She has no wheezes. She has no rhonchi. She has rales in the left lower field.  Lymphadenopathy:       Head (right side): No submandibular, no preauricular and no posterior auricular adenopathy present.       Head (left  side): No submandibular, no preauricular and no posterior auricular adenopathy present.    She has no cervical adenopathy.       Right: No supraclavicular adenopathy present.       Left: No supraclavicular adenopathy present.  Neurological: She is oriented to person, place, and time. She appears lethargic.  Skin: Skin is warm and dry. She is not diaphoretic. No erythema.  Psychiatric: She has a normal mood and affect. Her behavior is normal.     BP 107/74   Pulse (!) 139   Temp 98.2 F (36.8 C) (Oral)   Resp 16   Ht 5' 2"  (1.575 m)   Wt 144 lb 12.8 oz (65.7 kg)   LMP 02/12/2017   SpO2 94%   BMI 26.48 kg/m   + orthostatic VS Orthostatic VS for the past 24 hrs:  BP- Lying Pulse- Lying BP- Sitting Pulse- Sitting BP- Standing at 0 minutes Pulse- Standing at 0 minutes  03/02/17 0902 102/63 83 104/66 96 94/65 111    [facility] reading (PRIMARY) by  Dr. Brigitte Pulse. EKG: Sinus tachycardia. No overt signs of strain, no S1Q3T3    Results for orders placed or performed in visit on 03/02/17  POCT CBC  Result Value Ref Range   WBC 17.0 (A) 4.6 - 10.2 K/uL   Lymph, poc 2.4 0.6 - 3.4   POC LYMPH PERCENT 14.4 10 - 50 %L   MID (cbc) 1.0 (A) 0 - 0.9   POC MID % 5.7 0 - 12 %M   POC Granulocyte 13.6 (A) 2 - 6.9   Granulocyte percent 79.9 37 - 80 %G   RBC 4.14 4.04 - 5.48 M/uL   Hemoglobin 12.5 12.2 - 16.2 g/dL   HCT, POC 35.9 (A) 37.7 - 47.9 %   MCV 86.6 80 - 97 fL   MCH, POC 30.3 27 - 31.2 pg   MCHC 34.9 31.8 - 35.4 g/dL   RDW, POC 13.6 %   Platelet Count, POC 627 (A) 142 - 424 K/uL   MPV 6.7 0 - 99.8 fL  POCT urinalysis dipstick  Result Value Ref Range   Color, UA yellow yellow   Clarity, UA cloudy (A) clear   Glucose, UA negative  negative mg/dL   Bilirubin, UA small (A) negative   Ketones, POC UA trace (5) (A) negative mg/dL   Spec Grav, UA 1.015 1.010 - 1.025   Blood, UA negative negative   pH, UA 7.0 5.0 - 8.0   Protein Ur, POC =30 (A) negative mg/dL   Urobilinogen, UA 1.0 0.2 or  1.0 E.U./dL   Nitrite, UA Negative Negative   Leukocytes, UA Negative Negative   Dg Chest 2 View  Result Date: 03/02/2017 CLINICAL DATA:  recheck LLL pneumonia, minimal improvement s/p azithromycin and Levaquin EXAM: CHEST  2 VIEW COMPARISON:  02/25/2017 FINDINGS: There is dense opacity in the left lower lobe which obscures the hemidiaphragm and is associated with pleural effusion. There has been some improvement in aeration of the lingula. The right lung remains clear. No pulmonary edema. Heart size is normal. IMPRESSION: Persistent significant consolidation of the left lower lobe. Continued follow-up is recommended. Electronically Signed   By: Nolon Nations M.D.   On: 03/02/2017 11:13   Dg Chest 2 View  Result Date: 02/25/2017 CLINICAL DATA:  36 year old female with a 2 week history of cough and congestion EXAM: CHEST  2 VIEW COMPARISON:  None. FINDINGS: Patchy airspace opacity in the lingula and left lower lobe obscuring both of the diaphragm and the left heart on the frontal view. Findings are most concerning for bronchopneumonia. The cardiac and mediastinal contours are within normal limits in size. No pleural effusion or pneumothorax. No evidence of pulmonary edema. Upper abdominal bowel gas pattern is unremarkable. Osseous structures are intact and unremarkable for age. IMPRESSION: Lingular, and likely left lower lobe pneumonia. Electronically Signed   By: Jacqulynn Cadet M.D.   On: 02/25/2017 10:08    Assessment & Plan:  Will do 5 more days of levaqun at least- will need 3 more days starting Sat amox anaphylaxis so cant do rocephin Already completed azithro '500mg'$  qd x 3d 5/10-12.  1. Pneumonia of left lower lobe due to infectious organism ([place])   2. Acute renal failure, unspecified acute renal failure type ([place])   3. Tachycardia   4. Dehydration    Discussed case with DOD at [facility] Dr. Baird Lyons who advised that the metabolic demands of breastfeeding combined with the  severity of pna are likely prolonging her recovery time but as not worsening at this point to stay the course and lengthen levaquin course to 10d (so 5 additional days) with f/u in 4d. Cont to push fluids  Orders Placed This Encounter  Procedures  . DG Chest 2 View    Standing Status:   Future    Number of Occurrences:   1    Standing Expiration Date:   03/02/2018    Order Specific Question:   Reason for Exam (SYMPTOM  OR DIAGNOSIS REQUIRED)    Answer:   recheck LLL pneumonia, minimal improvement s/p azithro and levaquim    Order Specific Question:   Is the patient pregnant?    Answer:   No    Order Specific Question:   Preferred imaging location?    Answer:   External  . Comprehensive metabolic panel  . Orthostatic vital signs  . POCT CBC  . POCT SEDIMENTATION RATE  . POCT urinalysis dipstick  . EKG 12-Lead  . Insert peripheral IV    Meds ordered this encounter  Medications  . levofloxacin (LEVAQUIN) 750 MG tablet    Sig: Take 1 tablet (750 mg total) by mouth daily.    Dispense:  7 tablet  Refill:  0    Needs additional rx to complete a 10-14d course  . chlorpheniramine-HYDROcodone (TUSSIONEX PENNKINETIC ER) 10-8 MG/5ML SUER    Sig: Take 5 mLs by mouth every 12 (twelve) hours as needed.    Dispense:  140 mL    Refill:  0    Dominique Powers, M.D.  Primary Care at [facility] [address] [place], [place] [address] [phone] phone [phone] fax  03/02/17 7:36 PM

## 2017-03-02 ENCOUNTER — Ambulatory Visit (INDEPENDENT_AMBULATORY_CARE_PROVIDER_SITE_OTHER): Payer: Commercial Managed Care - PPO | Admitting: Family Medicine

## 2017-03-02 ENCOUNTER — Encounter: Payer: Self-pay | Admitting: Family Medicine

## 2017-03-02 ENCOUNTER — Ambulatory Visit (INDEPENDENT_AMBULATORY_CARE_PROVIDER_SITE_OTHER): Payer: Commercial Managed Care - PPO

## 2017-03-02 VITALS — BP 120/80 | HR 82 | Temp 98.2°F | Resp 16 | Ht 62.0 in | Wt 144.8 lb

## 2017-03-02 DIAGNOSIS — N179 Acute kidney failure, unspecified: Secondary | ICD-10-CM | POA: Diagnosis not present

## 2017-03-02 DIAGNOSIS — R000 Tachycardia, unspecified: Secondary | ICD-10-CM

## 2017-03-02 DIAGNOSIS — J181 Lobar pneumonia, unspecified organism: Secondary | ICD-10-CM

## 2017-03-02 DIAGNOSIS — E860 Dehydration: Secondary | ICD-10-CM

## 2017-03-02 DIAGNOSIS — J189 Pneumonia, unspecified organism: Secondary | ICD-10-CM

## 2017-03-02 LAB — POCT CBC
GRANULOCYTE PERCENT: 80.6 % — AB (ref 37–80)
HCT, POC: 36.1 % — AB (ref 37.7–47.9)
HEMOGLOBIN: 12.5 g/dL (ref 12.2–16.2)
Lymph, poc: 2.3 (ref 0.6–3.4)
MCH: 29.9 pg (ref 27–31.2)
MCHC: 34.7 g/dL (ref 31.8–35.4)
MCV: 86.1 fL (ref 80–97)
MID (CBC): 0.8 (ref 0–0.9)
MPV: 6.7 fL (ref 0–99.8)
PLATELET COUNT, POC: 609 10*3/uL — AB (ref 142–424)
POC Granulocyte: 13.2 — AB (ref 2–6.9)
POC LYMPH PERCENT: 14.3 %L (ref 10–50)
POC MID %: 5.1 %M (ref 0–12)
RBC: 4.2 M/uL (ref 4.04–5.48)
RDW, POC: 13.3 %
WBC: 16.4 10*3/uL — AB (ref 4.6–10.2)

## 2017-03-02 LAB — POCT URINALYSIS DIP (MANUAL ENTRY)
Blood, UA: NEGATIVE
GLUCOSE UA: NEGATIVE mg/dL
Leukocytes, UA: NEGATIVE
Nitrite, UA: NEGATIVE
Protein Ur, POC: 30 mg/dL — AB
SPEC GRAV UA: 1.015 (ref 1.010–1.025)
Urobilinogen, UA: 1 E.U./dL
pH, UA: 7 (ref 5.0–8.0)

## 2017-03-02 LAB — POCT SEDIMENTATION RATE: POCT SED RATE: 119 mm/hr — AB (ref 0–22)

## 2017-03-02 MED ORDER — HYDROCOD POLST-CPM POLST ER 10-8 MG/5ML PO SUER: 5.0000 mL | Freq: Two times a day (BID) | ORAL | 0 refills | Status: DC | PRN

## 2017-03-02 MED ORDER — LEVOFLOXACIN 750 MG PO TABS: 750.0000 mg | ORAL_TABLET | Freq: Every day | ORAL | 0 refills | Status: DC

## 2017-03-02 NOTE — Patient Instructions (Addendum)
   IF you received an x-ray today, you will receive an invoice from [facility]. Please contact [facility] at [phone] with questions or concerns regarding your invoice.   IF you received labwork today, you will receive an invoice from [facility]. Please contact [facility] at [phone] with questions or concerns regarding your invoice.   Our billing staff will not be able to assist you with questions regarding bills from these companies.  You will be contacted with the lab results as soon as they are available. The fastest way to get your results is to activate your My Chart account. Instructions are located on the last page of this paperwork. If you have not heard from us regarding the results in 2 weeks, please contact this office.      Community-Acquired Pneumonia, Adult Pneumonia is an infection of the lungs. There are different types of pneumonia. One type can develop while a person is in a hospital. A different type, called community-acquired pneumonia, develops in people who are not, or have not recently been, in the hospital or other health care facility. What are the causes? Pneumonia may be caused by bacteria, viruses, or funguses. Community-acquired pneumonia is often caused by Streptococcus pneumonia bacteria. These bacteria are often passed from one person to another by breathing in droplets from the cough or sneeze of an infected person. What increases the risk? The condition is more likely to develop in:  People who havechronic diseases, such as chronic obstructive pulmonary disease (COPD), asthma, congestive heart failure, cystic fibrosis, diabetes, or kidney disease.  People who haveearly-stage or late-stage HIV.  People who havesickle cell disease.  People who havehad their spleen removed (splenectomy).  People who havepoor dental hygiene.  People who havemedical conditions that increase the risk of breathing in (aspirating) secretions  their own mouth and nose.  People who havea weakened immune system (immunocompromised).  People who smoke.  People whotravel to areas where pneumonia-causing germs commonly exist.  People whoare around animal habitats or animals that have pneumonia-causing germs, including birds, bats, rabbits, cats, and farm animals. What are the signs or symptoms? Symptoms of this condition include:  Adry cough.  A wet (productive) cough.  Fever.  Sweating.  Chest pain, especially when breathing deeply or coughing.  Rapid breathing or difficulty breathing.  Shortness of breath.  Shaking chills.  Fatigue.  Muscle aches. How is this diagnosed? Your health care provider will take a medical history and perform a physical exam. You may also have other tests, including:  Imaging studies of your chest, including X-rays.  Tests to check your blood oxygen level and other blood gases.  Other tests on blood, mucus (sputum), fluid around your lungs (pleural fluid), and urine. If your pneumonia is severe, other tests may be done to identify the specific cause of your illness. How is this treated? The type of treatment that you receive depends on many factors, such as the cause of your pneumonia, the medicines you take, and other medical conditions that you have. For most adults, treatment and recovery from pneumonia may occur at home. In some cases, treatment must happen in a hospital. Treatment may include:  Antibiotic medicines, if the pneumonia was caused by bacteria.  Antiviral medicines, if the pneumonia was caused by a virus.  Medicines that are given by mouth or through an IV tube.  Oxygen.  Respiratory therapy. Although rare, treating severe pneumonia may include:  Mechanical ventilation. This is done if you are not breathing well on your   own and you cannot maintain a safe blood oxygen level.  Thoracentesis. This procedureremoves fluid around one lung or both lungs to help  you breathe better. Follow these instructions at home:  Take over-the-counter and prescription medicines only as told by your health care provider.  Only takecough medicine if you are losing sleep. Understand that cough medicine can prevent your body's natural ability to remove mucus from your lungs.  If you were prescribed an antibiotic medicine, take it as told by your health care provider. Do not stop taking the antibiotic even if you start to feel better.  Sleep in a semi-upright position at night. Try sleeping in a reclining chair, or place a few pillows under your head.  Do not use tobacco products, including cigarettes, chewing tobacco, and e-cigarettes. If you need help quitting, ask your health care provider.  Drink enough water to keep your urine clear or pale yellow. This will help to thin out mucus secretions in your lungs. How is this prevented? There are ways that you can decrease your risk of developing community-acquired pneumonia. Consider getting a pneumococcal vaccine if:  You are older than 37 years of age.  You are older than 37 years of age and are undergoing cancer treatment, have chronic lung disease, or have other medical conditions that affect your immune system. Ask your health care provider if this applies to you. There are different types and schedules of pneumococcal vaccines. Ask your health care provider which vaccination option is best for you. You may also prevent community-acquired pneumonia if you take these actions:  Get an influenza vaccine every year. Ask your health care provider which type of influenza vaccine is best for you.  Go to the dentist on a regular basis.  Wash your hands often. Use hand sanitizer if soap and water are not available. Contact a health care provider if:  You have a fever.  You are losing sleep because you cannot control your cough with cough medicine. Get help right away if:  You have worsening shortness of  breath.  You have increased chest pain.  Your sickness becomes worse, especially if you are an older adult or have a weakened immune system.  You cough up blood. This information is not intended to replace advice given to you by your health care provider. Make sure you discuss any questions you have with your health care provider. Document Released: 10/03/2005 Document Revised: 02/11/2016 Document Reviewed: 01/28/2015 [employer]vier Interactive Patient Education  2017 [employer]  

## 2017-03-03 LAB — COMPREHENSIVE METABOLIC PANEL
A/G RATIO: 1.1 — AB (ref 1.2–2.2)
ALBUMIN: 3.4 g/dL — AB (ref 3.5–5.5)
ALT: 22 IU/L (ref 0–32)
AST: 19 IU/L (ref 0–40)
Alkaline Phosphatase: 100 IU/L (ref 39–117)
BUN / CREAT RATIO: 14 (ref 9–23)
BUN: 10 mg/dL (ref 6–20)
Bilirubin Total: 0.3 mg/dL (ref 0.0–1.2)
CALCIUM: 9.3 mg/dL (ref 8.7–10.2)
CO2: 23 mmol/L (ref 18–29)
Chloride: 100 mmol/L (ref 96–106)
Creatinine, Ser: 0.69 mg/dL (ref 0.57–1.00)
GFR, EST AFRICAN AMERICAN: 130 mL/min/{1.73_m2} (ref >59)
GFR, EST NON AFRICAN AMERICAN: 112 mL/min/{1.73_m2} (ref >59)
GLOBULIN, TOTAL: 3.2 g/dL (ref 1.5–4.5)
Glucose: 94 mg/dL (ref 65–99)
Potassium: 5.1 mmol/L (ref 3.5–5.2)
Sodium: 140 mmol/L (ref 134–144)
Total Protein: 6.6 g/dL (ref 6.0–8.5)

## 2017-03-05 NOTE — Progress Notes (Signed)
Subjective:    Patient ID: Dominique Powers, female    DOB: Oct 06, 1981, 36 y.o.   MRN: [id number] Chief Complaint  Patient presents with  . Follow-up    pneumonia    HPI  Dominique Powers is a delightful 36 yo woman here to follow-up on a community-acquired left lower lobe pneumonia that presented with pt in sepsis and acute renal failure from the severity of dehydration from fever and vomiting. She was initially seen by a colleague on 5/7 who thought that she had an otitis externa but rapidly worsened so was then started on azithromycin 523m qd 11d prior - antibiotics were actually started the day prior to that but she could not keep abx down due to intractable vomiting. Pt had improvement the following day after antibiotics and IVF hydration but pneumonia then blossomed into the left lower lobe with ongoing night sweats and severe DOE. Pt had some mild improvement in leukocytosis but esr increased a little so followed the course of azithro 5041mqd x 3d by levaquin 7503md. Last recheck 4d prior, pt had completed 5d of the levaquin but still w/o any improvement in cbc or esr and had experienced a secondary worsening of sxs after some initial improvement. Repeat CXR showed some limited improvement in clearing of the lingula but development of a left pleural effusion. [facility] who recommended staying to course with 10d of levaquin and close f/u - suspected slow improvement was due to the large metabolic demands of breastfeeding with minimal intake and recurrent severe dehydration.  At last visit 4d prior, renal fxn was back to nml with nml cmp.  Still feeling foggy headed and a little out-of-it but not lightheaded and dizzy with standing anymore.  Drinking tons of fluids, eating well, keeping urine light. Still coughing, not as frequently but more deep and is productive of yellow mucus. No shortness of breath at rest or wheezing. Still with dyspnea on exertion. Taking  Tussionex at night which helps her sleep. Took day 9 of Levaquin today. Still taking Mucinex  Left ear is still very painful. Feels like she needs to pop it but nothing helps. Stop using the nasal spray several days ago as they were unsuccessful. Definitely causing her to feel off balance and foggy  Past Medical History:  Diagnosis Date  . ADD (attention deficit disorder)   . GDM, class A1 04/06/2016  . Gestational diabetes mellitus (GDM), antepartum   . 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 Postpartum care following cesarean delivery (6/22) 04/07/2016   Past Surgical History:  Procedure Laterality Date  . CESAREAN SECTION N/A 04/07/2016   Procedure: CESAREAN SECTION;  Surgeon: VaiAzucena FallenD;  [facility];  Laterality: N/A;  . MOLE REMOVAL    . WISDOM TOOTH EXTRACTION     Current Outpatient Prescriptions on File Prior to Visit  Medication Sig Dispense Refill  . acetaminophen (TYLENOL) 500 MG tablet Take 1,000 mg by mouth every 6 (six) hours as needed for moderate pain or headache.     . benzonatate (TESSALON) 200 MG capsule Take 1 capsule (200 mg total) by mouth 3 (three) times daily as needed for cough. 30 capsule 0  . butalbital-aspirin-caffeine (FIORINAL) 50-325-40 MG capsule TAKE ONE CAPSULE BY MOUTH EVERY 6 HOURS AS NEEDED FOR HEADACHE 20 capsule 1  . levofloxacin (LEVAQUIN) 750 MG tablet Take 1 tablet (750 mg total) by mouth daily. 7 tablet 0  . sertraline (ZOLOFT) 100 MG tablet  Take 1 tablet (100 mg total) by mouth daily. 90 tablet 1  . albuterol (PROVENTIL HFA;VENTOLIN HFA) 108 (90 Base) MCG/ACT inhaler Inhale 2 puffs into the lungs every 4 (four) hours as needed for wheezing or shortness of breath (cough, shortness of breath or wheezing.). (Patient not taking: Reported on 03/06/2017) 1 Inhaler 1  . loratadine (CLARITIN) 10 MG tablet Take 1 tablet (10 mg total) by mouth daily. (Patient not taking: Reported on 03/02/2017) 30 tablet 11  . ondansetron  (ZOFRAN-ODT) 8 MG disintegrating tablet Take 1 tablet (8 mg total) by mouth every 8 (eight) hours as needed for nausea or vomiting. (Patient not taking: Reported on 03/02/2017) 20 tablet 0  . Prenatal Vit-Fe Fumarate-FA (PRENATAL VITAMINS PLUS) 27-1 MG TABS Take 1 tablet by mouth daily.    . pseudoephedrine (SUDAFED 12 HOUR) 120 MG 12 hr tablet Take 1 tablet (120 mg total) by mouth 2 (two) times daily. (Patient not taking: Reported on 02/25/2017) 28 tablet 1   No current facility-administered medications on file prior to visit.    Allergies  Allergen Reactions  . Amoxicillin Anaphylaxis    Has patient had a PCN reaction causing immediate rash, facial/tongue/throat swelling, SOB or lightheadedness with hypotension: Yes Has patient had a PCN reaction causing severe rash involving mucus membranes or skin necrosis: No Has patient had a PCN reaction that required hospitalization Yes Has patient had a PCN reaction occurring within the last 10 years: No If all of the above answers are "NO", then may proceed with Cephalosporin use.    Family History  Problem Relation Age of Onset  . Hyperlipidemia Mother   . 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 Hemophilia Cousin        maternal female cousin  . Hemophilia Cousin        maternal female cousin  . Diabetes Maternal Grandmother    Social History   Social History  . Marital status: Married    Spouse name: N/A  . Number of children: N/A  . Years of education: N/A   Social History Main Topics  . Smoking status: Never Smoker  . Smokeless tobacco: Never Used  . Alcohol use No     Comment: occasional   . Drug use: No  . Sexual activity: Yes   Other Topics Concern  . None   Social History Narrative  . None   Depression screen [facility] 2/9 03/02/2017 02/25/2017 02/23/2017 02/20/2017 11/14/2016  Decreased Interest 0 0 0 0 0  Down, Depressed, Hopeless 0 0 0 0 3  PHQ - 2 Score 0 0 0 0 3  Altered sleeping - - - - 0  Tired, decreased energy - - -  - 3  Change in appetite - - - - 3  Feeling bad or failure about yourself  - - - - 1  Trouble concentrating - - - - 3  Moving slowly or fidgety/restless - - - - 0  Suicidal thoughts - - - - 0  PHQ-9 Score - - - - 13  Difficult doing work/chores - - - - Not difficult at all    Review of Systems  Constitutional: Positive for activity change (still decreased), appetite change (increasing) and fatigue. Negative for chills, diaphoresis, fever and unexpected weight change.  HENT: Positive for ear pain and hearing loss. Negative for congestion, ear discharge, facial swelling, postnasal drip, rhinorrhea, sinus pain, sinus pressure and sore throat.   Eyes: Negative for visual disturbance.  Respiratory: Positive for cough and shortness of breath. Negative for chest tightness and wheezing.   Gastrointestinal: Negative for abdominal pain, anal bleeding, blood in stool, constipation, diarrhea, nausea and vomiting.  Endocrine: Negative for polydipsia, polyphagia and polyuria.  Genitourinary: Negative for decreased urine volume, difficulty urinating, dysuria, frequency, hematuria and urgency.  Neurological: Positive for weakness and headaches. Negative for dizziness, tremors, syncope, speech difficulty, light-headedness and numbness.  Hematological: Negative for adenopathy.  Psychiatric/Behavioral: Positive for confusion, decreased concentration and sleep disturbance. Negative for agitation, behavioral problems and hallucinations. The patient is nervous/anxious. The patient is not hyperactive.    See hpi    Objective:   Physical Exam  Constitutional: She is oriented to person, place, and time. She appears well-developed and well-nourished. No distress.  HENT:  Head: Normocephalic and atraumatic.  Right Ear: External ear and ear canal normal. Tympanic membrane is injected. A middle ear effusion is present.  Left Ear: Ear canal normal. There is tenderness. Tympanic membrane is erythematous (dull maroon)  and retracted. Tympanic membrane is not perforated. Tympanic membrane mobility is abnormal.  No middle ear effusion. Decreased hearing is noted.  Nose: Mucosal edema present. No rhinorrhea.  Mouth/Throat: Uvula is midline, oropharynx is clear and moist and mucous membranes are normal. No oropharyngeal exudate, posterior oropharyngeal edema or posterior oropharyngeal erythema.  Eyes: Conjunctivae are normal. Right eye exhibits no discharge. Left eye exhibits no discharge. No scleral icterus.  Neck: Normal range of motion. Neck supple.  Cardiovascular: Normal rate, regular rhythm, normal heart sounds and intact distal pulses.   Pulmonary/Chest: Effort normal and breath sounds normal. No respiratory distress. She has no decreased breath sounds. She has no wheezes. She has no rhonchi. She has no rales.  LLL rales resolved!  Lymphadenopathy:    She has no cervical adenopathy.  Neurological: She is alert and oriented to person, place, and time.  Skin: Skin is warm and dry. She is not diaphoretic. No erythema.  Psychiatric: She has a normal mood and affect. Her behavior is normal.      BP 92/60 (BP Location: Right Arm, Patient Position: Sitting, Cuff Size: Normal)   Pulse 96   Temp 98.6 F (37 C) (Oral)   Resp 16   Ht 5' 2.25" (1.581 m)   Wt 143 lb (64.9 kg)   LMP 02/12/2017   SpO2 97%   BMI 25.95 kg/m   Results for orders placed or performed in visit on 03/06/17  POCT CBC  Result Value Ref Range   WBC 10.9 (A) 4.6 - 10.2 K/uL   Lymph, poc 2.3 0.6 - 3.4   POC LYMPH PERCENT 20.9 10 - 50 %L   MID (cbc) 0.4 0 - 0.9   POC MID % 3.9 0 - 12 %M   POC Granulocyte 8.2 (A) 2 - 6.9   Granulocyte percent 75.2 37 - 80 %G   RBC 4.00 (A) 4.04 - 5.48 M/uL   Hemoglobin 11.9 (A) 12.2 - 16.2 g/dL   HCT, POC 34.2 (A) 37.7 - 47.9 %   MCV 85.4 80 - 97 fL   MCH, POC 29.7 27 - 31.2 pg   MCHC 34.7 31.8 - 35.4 g/dL   RDW, POC 12.9 %   Platelet Count, POC 894 (A) 142 - 424 K/uL   MPV 6.6 0 - 99.8 fL    CBC Latest Ref Rng & Units 03/06/2017 03/02/2017 02/25/2017  WBC 4.6 - 10.2 K/uL 10.9(A) 16.4(A) 17.6(A)  Hemoglobin 12.2 - 16.2 g/dL 11.9(A) 12.5 11.5(A)  Hematocrit  37.7 - 47.9 % 34.2(A) 36.1(A) 34.1(A)  Platelets 140 - 400 K/uL - - -   Lab Results  Component Value Date   POCTSEDRATE 104 (A) 03/06/2017   POCTSEDRATE 119 (A) 03/02/2017   POCTSEDRATE 129 (A) 02/25/2017   POCTSEDRATE 122 (A) 02/23/2017    Assessment & Plan:  Recheck in 1 mo for repeat CXR to document clearing. 1. Pneumonia of left lower lobe due to infectious organism ([place])   Finish 10d course of levaquin tomorrow. Sxs, exam, and wbc sig improved. Still seeing very high inflammatory markers with increasing plts which I think is still spilling over from the severity of illness and the concurrent demand of breastfeeding.  2.  Acute dysfunction of left eustachian tube - failed Sudafed and multiple nasal sprays including Flonase and Afrin. Has been very painful for patient for well over 2 weeks since she initially presented with this complaint on 5/7. The muffled hearing and imbalance/lightheadedness that is causing is really causing significant disability so will try course of oral prednisone which she can stop as soon as symptoms are relieved. Reviewed risks and side effects in detail. Reminded to use a humidifier by the bed plenty of nasal saline and steam treatments concurrently as well as chewing gum and sucking on hard candies.  RTC if not continuing to improve  Orders Placed This Encounter  Procedures  . POCT SEDIMENTATION RATE  . POCT CBC    Meds ordered this encounter  Medications  . Dextromethorphan-Guaifenesin (MUCINEX DM PO)    Sig: Take by mouth every 12 (twelve) hours.  . chlorpheniramine-HYDROcodone (TUSSIONEX PENNKINETIC ER) 10-8 MG/5ML SUER    Sig: Take 5 mLs by mouth at bedtime as needed.    Dispense:  140 mL    Refill:  0  . predniSONE (DELTASONE) 20 MG tablet    Sig: Take 3 tabs qd x 3d, then 2  tabs qd x 3d then 1 tab qd x 3d.    Dispense:  18 tablet    Refill:  0     Delman Cheadle, M.D.  Primary Care at [facility] [address] [place], [place] [address] [phone] phone [phone] fax  03/07/17 3:56 PM

## 2017-03-06 ENCOUNTER — Ambulatory Visit (INDEPENDENT_AMBULATORY_CARE_PROVIDER_SITE_OTHER): Payer: Commercial Managed Care - PPO | Admitting: Family Medicine

## 2017-03-06 ENCOUNTER — Encounter: Payer: Self-pay | Admitting: Family Medicine

## 2017-03-06 VITALS — BP 92/60 | HR 96 | Temp 98.6°F | Resp 16 | Ht 62.25 in | Wt 143.0 lb

## 2017-03-06 DIAGNOSIS — H6982 Other specified disorders of Eustachian tube, left ear: Secondary | ICD-10-CM | POA: Diagnosis not present

## 2017-03-06 DIAGNOSIS — J181 Lobar pneumonia, unspecified organism: Secondary | ICD-10-CM | POA: Diagnosis not present

## 2017-03-06 DIAGNOSIS — J189 Pneumonia, unspecified organism: Secondary | ICD-10-CM

## 2017-03-06 LAB — POCT CBC
Granulocyte percent: 75.2 % (ref 37–80)
HCT, POC: 34.2 % — AB (ref 37.7–47.9)
Hemoglobin: 11.9 g/dL — AB (ref 12.2–16.2)
Lymph, poc: 2.3 (ref 0.6–3.4)
MCH, POC: 29.7 pg (ref 27–31.2)
MCHC: 34.7 g/dL (ref 31.8–35.4)
MCV: 85.4 fL (ref 80–97)
MID (cbc): 0.4 (ref 0–0.9)
MPV: 6.6 fL (ref 0–99.8)
POC Granulocyte: 8.2 — AB (ref 2–6.9)
POC LYMPH PERCENT: 20.9 % (ref 10–50)
POC MID %: 3.9 % (ref 0–12)
Platelet Count, POC: 894 K/uL — AB (ref 142–424)
RBC: 4 M/uL — AB (ref 4.04–5.48)
RDW, POC: 12.9 %
WBC: 10.9 K/uL — AB (ref 4.6–10.2)

## 2017-03-06 LAB — POCT SEDIMENTATION RATE: POCT SED RATE: 104 mm/h — AB (ref 0–22)

## 2017-03-06 MED ORDER — HYDROCOD POLST-CPM POLST ER 10-8 MG/5ML PO SUER: 5.0000 mL | Freq: Every evening | ORAL | 0 refills | Status: DC | PRN

## 2017-03-06 MED ORDER — PREDNISONE 20 MG PO TABS: ORAL_TABLET | ORAL | 0 refills | Status: DC

## 2017-03-06 NOTE — Patient Instructions (Addendum)
   IF you received an x-ray today, you will receive an invoice from [facility]. Please contact [facility] at [phone] with questions or concerns regarding your invoice.   IF you received labwork today, you will receive an invoice from [facility]. Please contact [facility] at [phone] with questions or concerns regarding your invoice.   Our billing staff will not be able to assist you with questions regarding bills from these companies.  You will be contacted with the lab results as soon as they are available. The fastest way to get your results is to activate your My Chart account. Instructions are located on the last page of this paperwork. If you have not heard from us regarding the results in 2 weeks, please contact this office.      Community-Acquired Pneumonia, Adult Pneumonia is an infection of the lungs. There are different types of pneumonia. One type can develop while a person is in a hospital. A different type, called community-acquired pneumonia, develops in people who are not, or have not recently been, in the hospital or other health care facility. What are the causes? Pneumonia may be caused by bacteria, viruses, or funguses. Community-acquired pneumonia is often caused by Streptococcus pneumonia bacteria. These bacteria are often passed from one person to another by breathing in droplets from the cough or sneeze of an infected person. What increases the risk? The condition is more likely to develop in:  People who havechronic diseases, such as chronic obstructive pulmonary disease (COPD), asthma, congestive heart failure, cystic fibrosis, diabetes, or kidney disease.  People who haveearly-stage or late-stage HIV.  People who havesickle cell disease.  People who havehad their spleen removed (splenectomy).  People who havepoor dental hygiene.  People who havemedical conditions that increase the risk of breathing in (aspirating) secretions  their own mouth and nose.  People who havea weakened immune system (immunocompromised).  People who smoke.  People whotravel to areas where pneumonia-causing germs commonly exist.  People whoare around animal habitats or animals that have pneumonia-causing germs, including birds, bats, rabbits, cats, and farm animals. What are the signs or symptoms? Symptoms of this condition include:  Adry cough.  A wet (productive) cough.  Fever.  Sweating.  Chest pain, especially when breathing deeply or coughing.  Rapid breathing or difficulty breathing.  Shortness of breath.  Shaking chills.  Fatigue.  Muscle aches. How is this diagnosed? Your health care provider will take a medical history and perform a physical exam. You may also have other tests, including:  Imaging studies of your chest, including X-rays.  Tests to check your blood oxygen level and other blood gases.  Other tests on blood, mucus (sputum), fluid around your lungs (pleural fluid), and urine. If your pneumonia is severe, other tests may be done to identify the specific cause of your illness. How is this treated? The type of treatment that you receive depends on many factors, such as the cause of your pneumonia, the medicines you take, and other medical conditions that you have. For most adults, treatment and recovery from pneumonia may occur at home. In some cases, treatment must happen in a hospital. Treatment may include:  Antibiotic medicines, if the pneumonia was caused by bacteria.  Antiviral medicines, if the pneumonia was caused by a virus.  Medicines that are given by mouth or through an IV tube.  Oxygen.  Respiratory therapy. Although rare, treating severe pneumonia may include:  Mechanical ventilation. This is done if you are not breathing well on your   own and you cannot maintain a safe blood oxygen level.  Thoracentesis. This procedureremoves fluid around one lung or both lungs to help  you breathe better. Follow these instructions at home:  Take over-the-counter and prescription medicines only as told by your health care provider.  Only takecough medicine if you are losing sleep. Understand that cough medicine can prevent your body's natural ability to remove mucus from your lungs.  If you were prescribed an antibiotic medicine, take it as told by your health care provider. Do not stop taking the antibiotic even if you start to feel better.  Sleep in a semi-upright position at night. Try sleeping in a reclining chair, or place a few pillows under your head.  Do not use tobacco products, including cigarettes, chewing tobacco, and e-cigarettes. If you need help quitting, ask your health care provider.  Drink enough water to keep your urine clear or pale yellow. This will help to thin out mucus secretions in your lungs. How is this prevented? There are ways that you can decrease your risk of developing community-acquired pneumonia. Consider getting a pneumococcal vaccine if:  You are older than 37 years of age.  You are older than 37 years of age and are undergoing cancer treatment, have chronic lung disease, or have other medical conditions that affect your immune system. Ask your health care provider if this applies to you. There are different types and schedules of pneumococcal vaccines. Ask your health care provider which vaccination option is best for you. You may also prevent community-acquired pneumonia if you take these actions:  Get an influenza vaccine every year. Ask your health care provider which type of influenza vaccine is best for you.  Go to the dentist on a regular basis.  Wash your hands often. Use hand sanitizer if soap and water are not available. Contact a health care provider if:  You have a fever.  You are losing sleep because you cannot control your cough with cough medicine. Get help right away if:  You have worsening shortness of  breath.  You have increased chest pain.  Your sickness becomes worse, especially if you are an older adult or have a weakened immune system.  You cough up blood. This information is not intended to replace advice given to you by your health care provider. Make sure you discuss any questions you have with your health care provider. Document Released: 10/03/2005 Document Revised: 02/11/2016 Document Reviewed: 01/28/2015 Elsevier Interactive Patient Education  2017 [employer]  

## 2017-03-14 ENCOUNTER — Other Ambulatory Visit: Payer: Self-pay | Admitting: Family Medicine

## 2017-03-14 DIAGNOSIS — H73892 Other specified disorders of tympanic membrane, left ear: Secondary | ICD-10-CM

## 2017-03-15 NOTE — Addendum Note (Signed)
Addended by: Norberto SorensonSHAW, EVA on: 03/15/2017 04:26 PM   Modules accepted: Orders

## 2017-03-21 ENCOUNTER — Other Ambulatory Visit: Payer: Self-pay | Admitting: Family Medicine

## 2017-03-24 NOTE — Telephone Encounter (Signed)
Yes. Printed refill. Please fax or call in to patient's pharmacy. Thank you

## 2017-03-24 NOTE — Telephone Encounter (Signed)
Called to [facility]. 

## 2017-04-02 NOTE — Progress Notes (Signed)
Subjective:    Patient ID: Dominique Powers, female    DOB: August 12, 1981, 36 y.o.   MRN: [id number] Chief Complaint  Patient presents with  . follow up    Pneumonia, "still can't hear out of left ear, little cough every now/then    HPI  Dominique Powers is a delightful 36 year old woman who is here for a one-month follow-up after a severe left lower lobe pneumonia where she actually presented in sepsis. Fortunately with recurrent d doses of IV antibiotics, several courses of prolonged oral antibiotics, several days of IV fluids, and close management and symptomatic care, we were able to keep patient out of the hospital. She has been breast-feeding her almost 37-year-old during this time and not wanting to wean, which likely has made her energy and immune system full recovery time a little slower due the additional loss of nutrients and fluid beyond that from decreased appetite and fevers.  Lab Results  Component Value Date   POCTSEDRATE 104 (A) 03/06/2017   POCTSEDRATE 119 (A) 03/02/2017   POCTSEDRATE 129 (A) 02/25/2017   POCTSEDRATE 122 (A) 02/23/2017   CBC Latest Ref Rng & Units 03/06/2017 03/02/2017 02/25/2017 02/23/2017  WBC 4.6 - 10.2 K/uL 10.9(A) 16.4(A) 17.6(A) 21.3(A)  Hemoglobin 12.2 - 16.2 g/dL 11.9(A) 12.5 11.5(A) 11.8(A)  Hematocrit 37.7 - 47.9 % 34.2(A) 36.1(A) 34.1(A) 33.4(A)  Platelets 140 - 400 K/uL 894 609 284 232   5/17: cmp nml (albumin a smidge low). UA + 30 protein, sml bili, trc ketones  Iniitially prsented with  Lab Results  Component Value Date   CREATININE 0.69 03/02/2017   CREATININE 1.06 (H) 02/25/2017   CREATININE 1.89 (H) 02/23/2017      Was on mucinex-D but makes her feel funny  Past Medical History:  Diagnosis Date  . ADD (attention deficit disorder)   . GDM, class A1 04/06/2016  . Gestational diabetes mellitus (GDM), antepartum   . 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 Postpartum care following cesarean delivery (6/22) 04/07/2016   Past Surgical History:  Procedure  Laterality Date  . CESAREAN SECTION N/A 04/07/2016   Procedure: CESAREAN SECTION;  Surgeon: Azucena Fallen, MD;  Location: [facility];  Service: Obstetrics;  Laterality: N/A;  . MOLE REMOVAL    . WISDOM TOOTH EXTRACTION     Current Outpatient Prescriptions on File Prior to Visit  Medication Sig Dispense Refill  . acetaminophen (TYLENOL) 500 MG tablet Take 1,000 mg by mouth every 6 (six) hours as needed for moderate pain or headache.     . butalbital-aspirin-caffeine (FIORINAL) 50-325-40 MG capsule TAKE 1 CAPSULE BY MOUTH EVERY 6 HOURS AS NEEDED 20 capsule 1  . sertraline (ZOLOFT) 100 MG tablet Take 1 tablet (100 mg total) by mouth daily. 90 tablet 1  . Prenatal Vit-Fe Fumarate-FA (PRENATAL VITAMINS PLUS) 27-1 MG TABS Take 1 tablet by mouth daily.     No current facility-administered medications on file prior to visit.    Allergies  Allergen Reactions  . Amoxicillin Anaphylaxis    Has patient had a PCN reaction causing immediate rash, facial/tongue/throat swelling, SOB or lightheadedness with hypotension: Yes Has patient had a PCN reaction causing severe rash involving mucus membranes or skin necrosis: No Has patient had a PCN reaction that required hospitalization Yes Has patient had a PCN reaction occurring within the last 10 years: No If all of the above answers are "NO", then may proceed with Cephalosporin use.    Family History  Problem Relation Age of Onset  . Hyperlipidemia Mother   .  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 Hemophilia Cousin        maternal female cousin  . Hemophilia Cousin        maternal female cousin  . Diabetes Maternal Grandmother    Social History   Social History  . Marital status: Married    Spouse name: N/A  . Number of children: N/A  . Years of education: N/A   Social History Main Topics  . Smoking status: Never Smoker  . Smokeless tobacco: Never Used  . Alcohol use No     Comment: occasional   . Drug use: No  . Sexual  activity: Yes   Other Topics Concern  . None   Social History Narrative  . None   Depression screen [facility] 2/9 04/03/2017 03/02/2017 02/25/2017 02/23/2017 02/20/2017  Decreased Interest 0 0 0 0 0  Down, Depressed, Hopeless 0 0 0 0 0  PHQ - 2 Score 0 0 0 0 0  Altered sleeping - - - - -  Tired, decreased energy - - - - -  Change in appetite - - - - -  Feeling bad or failure about yourself  - - - - -  Trouble concentrating - - - - -  Moving slowly or fidgety/restless - - - - -  Suicidal thoughts - - - - -  PHQ-9 Score - - - - -  Difficult doing work/chores - - - - -    Review of Systems Se hpi    Objective:   Physical Exam  Constitutional: She is oriented to person, place, and time. She appears well-developed and well-nourished. No distress.  HENT:  Head: Normocephalic and atraumatic.  Right Ear: Tympanic membrane, external ear and ear canal normal.  Left Ear: Tympanic membrane, external ear and ear canal normal.  Nose: Nose normal. No mucosal edema or rhinorrhea.  Mouth/Throat: Uvula is midline, oropharynx is clear and moist and mucous membranes are normal. No oropharyngeal exudate.  Eyes: Conjunctivae are normal. Right eye exhibits no discharge. Left eye exhibits no discharge. No scleral icterus.  Neck: Normal range of motion. Neck supple.  Cardiovascular: Normal rate, regular rhythm, normal heart sounds and intact distal pulses.   Pulmonary/Chest: Effort normal and breath sounds normal.  Lymphadenopathy:    She has no cervical adenopathy.  Neurological: She is alert and oriented to person, place, and time.  Skin: Skin is warm and dry. She is not diaphoretic. No erythema.  Psychiatric: She has a normal mood and affect. Her behavior is normal.     Predicted peak flow: 479 L/min  BP 128/88   Pulse 99   Temp 98.6 F (37 C) (Oral)   Resp 16   Ht _0  (1.575 m)   Wt 142 lb 12.8 oz (64.8 kg)   LMP 03/22/2017   SpO2 97%   BMI 26.12 kg/m    Results for orders placed or  performed in visit on 04/03/17  POCT CBC  Result Value Ref Range   WBC 4.9 4.6 - 10.2 K/uL   Lymph, poc 2.5 0.6 - 3.4   POC LYMPH PERCENT 51.4 (A) 10 - 50 %L   MID (cbc) 0.4 0 - 0.9   POC MID % 7.9 0 - 12 %M   POC Granulocyte 2.0 2 - 6.9   Granulocyte percent 40.7 37 - 80 %G   RBC 4.48 4.04 - 5.48 M/uL   Hemoglobin 13.8 12.2 - 16.2 g/dL   HCT, POC  38.9 37.7 - 47.9 %   MCV 86.8 80 - 97 fL   MCH, POC 30.7 27 - 31.2 pg   MCHC 35.4 31.8 - 35.4 g/dL   RDW, POC 13.4 %   Platelet Count, POC 206 142 - 424 K/uL   MPV 7.5 0 - 99.8 fL  POCT urinalysis dipstick  Result Value Ref Range   Color, UA yellow yellow   Clarity, UA clear clear   Glucose, UA negative negative mg/dL   Bilirubin, UA negative negative   Ketones, POC UA negative negative mg/dL   Spec Grav, UA 1.020 1.010 - 1.025   Blood, UA negative negative   pH, UA 6.5 5.0 - 8.0   Protein Ur, POC negative negative mg/dL   Urobilinogen, UA 0.2 0.2 or 1.0 E.U./dL   Nitrite, UA Negative Negative   Leukocytes, UA Negative Negative   Dg Chest 2 View  Result Date: 04/03/2017 CLINICAL DATA:  Recent pneumonia left lower lobe EXAM: CHEST  2 VIEW COMPARISON:  03/02/2017, 02/25/2017 FINDINGS: Significant improvement in the left lower lobe airspace consolidation compatible with resolving pneumonia. Minimal residual bandlike left lower lobe opacity compatible with remaining atelectasis or scarring. No significant effusion. Right lung remains clear. No pneumothorax. Trachea is midline. Normal heart size and vascularity. No acute osseous finding. IMPRESSION: Resolving left lower lobe pneumonia. Residual left lower lobe atelectasis versus scarring. Electronically Signed   By: Jerilynn Mages.  Shick M.D.   On: 04/03/2017 11:20     Assessment & Plan:  Ua, cbc, cmp, esr, crp, cxr Peak flow:   ENT said it would eventually resolve on its own - stay on flonase and stay on mucinex DM if she wants but it makes her feel terrible.  1. Pneumonia of left lower lobe  due to infectious organism [facility])     Orders Placed This Encounter  Procedures  . DG Chest 2 View    Standing Status:   Future    Number of Occurrences:   1    Standing Expiration Date:   04/02/2018    Order Specific Question:   Reason for Exam (SYMPTOM  OR DIAGNOSIS REQUIRED)    Answer:   f/u pna - document clearence, severe case but sxs now resolved x 1 mo    Order Specific Question:   Is the patient pregnant?    Answer:   No    Order Specific Question:   Preferred imaging location?    Answer:   External  . Comprehensive metabolic panel  . C-reactive protein  . Comprehensive metabolic panel  . Care order/instruction:    Scheduling Instructions:     Peak Flow (IF NEB IS ORDERED PLEASE DO BEFORE AND AFTER NEB)  . Care order/instruction:    Scheduling Instructions:     Complete orders, AVS and go.  Marland Kitchen POCT SEDIMENTATION RATE  . POCT CBC  . POCT urinalysis dipstick    Meds ordered this encounter  Medications  . levalbuterol (XOPENEX) nebulizer solution 0.63 mg    Delman Cheadle, M.D.  Primary Care at [facility] [address] [place], [place] [address] [phone] phone [phone] fax  04/05/17 10:00 PM

## 2017-04-03 ENCOUNTER — Ambulatory Visit (INDEPENDENT_AMBULATORY_CARE_PROVIDER_SITE_OTHER): Payer: Commercial Managed Care - PPO | Admitting: Family Medicine

## 2017-04-03 ENCOUNTER — Encounter: Payer: Self-pay | Admitting: Family Medicine

## 2017-04-03 ENCOUNTER — Ambulatory Visit (INDEPENDENT_AMBULATORY_CARE_PROVIDER_SITE_OTHER): Payer: Commercial Managed Care - PPO

## 2017-04-03 VITALS — BP 128/88 | HR 99 | Temp 98.6°F | Resp 16 | Ht 62.0 in | Wt 142.8 lb

## 2017-04-03 DIAGNOSIS — J181 Lobar pneumonia, unspecified organism: Secondary | ICD-10-CM

## 2017-04-03 DIAGNOSIS — J189 Pneumonia, unspecified organism: Secondary | ICD-10-CM

## 2017-04-03 LAB — POCT URINALYSIS DIP (MANUAL ENTRY)
BILIRUBIN UA: NEGATIVE mg/dL
Bilirubin, UA: NEGATIVE
Blood, UA: NEGATIVE
Glucose, UA: NEGATIVE mg/dL
LEUKOCYTES UA: NEGATIVE
NITRITE UA: NEGATIVE
PH UA: 6.5 (ref 5.0–8.0)
PROTEIN UA: NEGATIVE mg/dL
Spec Grav, UA: 1.02 (ref 1.010–1.025)
UROBILINOGEN UA: 0.2 U/dL

## 2017-04-03 LAB — POCT CBC
Granulocyte percent: 40.7 %G (ref 37–80)
HCT, POC: 38.9 % (ref 37.7–47.9)
HEMOGLOBIN: 13.8 g/dL (ref 12.2–16.2)
LYMPH, POC: 2.5 (ref 0.6–3.4)
MCH, POC: 30.7 pg (ref 27–31.2)
MCHC: 35.4 g/dL (ref 31.8–35.4)
MCV: 86.8 fL (ref 80–97)
MID (cbc): 0.4 (ref 0–0.9)
MPV: 7.5 fL (ref 0–99.8)
PLATELET COUNT, POC: 206 10*3/uL (ref 142–424)
POC GRANULOCYTE: 2 (ref 2–6.9)
POC LYMPH %: 51.4 % — AB (ref 10–50)
POC MID %: 7.9 %M (ref 0–12)
RBC: 4.48 M/uL (ref 4.04–5.48)
RDW, POC: 13.4 %
WBC: 4.9 10*3/uL (ref 4.6–10.2)

## 2017-04-03 LAB — POCT SEDIMENTATION RATE: POCT SED RATE: 10 mm/hr (ref 0–22)

## 2017-04-03 MED ORDER — LEVALBUTEROL HCL 0.63 MG/3ML IN NEBU
0.6300 mg | INHALATION_SOLUTION | Freq: Once | RESPIRATORY_TRACT | Status: AC
  Administered 2017-04-03: 0.63 mg via RESPIRATORY_TRACT

## 2017-04-03 NOTE — Patient Instructions (Addendum)
IF you received an x-ray today, you will receive an invoice from [facility]. Please contact [facility] at [phone] with questions or concerns regarding your invoice.   IF you received labwork today, you will receive an invoice from [facility]. Please contact [facility] at [phone] with questions or concerns regarding your invoice.   Our billing staff will not be able to assist you with questions regarding bills from these companies.  You will be contacted with the lab results as soon as they are available. The fastest way to get your results is to activate your My Chart account. Instructions are located on the last page of this paperwork. If you have not heard from [place] regarding the results in 2 weeks, please contact this office.     Bronchospasm, Adult Bronchospasm is a tightening of the airways going into the lungs. During an episode, it may be harder to breathe. You may cough, and you may make a whistling sound when you breathe (wheeze). This condition often affects people with asthma. What are the causes? This condition is caused by swelling and irritation in the airways. It can be triggered by:  An infection (common).  Seasonal allergies.  An allergic reaction.  Exercise.  Irritants. These include pollution, cigarette smoke, strong odors, aerosol sprays, and paint fumes.  Weather changes. Winds increase molds and pollens in the air. Cold air may cause swelling.  Stress and emotional upset.  What are the signs or symptoms? Symptoms of this condition include:  Wheezing. If the episode was triggered by an allergy, wheezing may start right away or hours later.  Nighttime coughing.  Frequent or severe coughing with a simple cold.  Chest tightness.  Shortness of breath.  Decreased ability to exercise.  How is this diagnosed? This condition is usually diagnosed with a review of your medical history and a physical exam. Tests, such as lung  function tests, are sometimes done to look for other conditions. The need for a chest X-ray depends on where the wheezing occurs and whether it is the first time you have wheezed. How is this treated? This condition may be treated with:  Inhaled medicines. These open up the airways and help you breathe. They can be taken with an inhaler or a nebulizer device.  Corticosteroid medicines. These may be given for severe bronchospasm, usually when it is associated with asthma.  Avoiding triggers, such as irritants, infection, or allergies.  Follow these instructions at home: Medicines  Take over-the-counter and prescription medicines only as told by your health care provider.  If you need to use an inhaler or nebulizer to take your medicine, ask your health care provider to explain how to use it correctly. If you were given a spacer, always use it with your inhaler. Lifestyle  Reduce the number of triggers in your home. To do this: ? Change your heating and air conditioning filter at least once a month. ? Limit your use of fireplaces and wood stoves. ? Do not smoke. Do not allow smoking in your home. ? Avoid using perfumes and fragrances. ? Get rid of pests, such as roaches and mice, and their droppings. ? Remove any mold from your home. ? Keep your house clean and dust free. Use unscented cleaning products. ? Replace carpet with wood, tile, or vinyl flooring. Carpet can trap dander and dust. ? Use allergy-proof pillows, mattress covers, and box spring covers. ? Wash bed sheets and blankets every week in hot water. Dry them in a  dryer. ? Use blankets that are made of polyester or cotton. ? Wash your hands often. ? Do not allow pets in your bedroom.  Avoid breathing in cold air when you exercise. General instructions  Have a plan for seeking medical care. Know when to call your health care provider and local emergency services, and where to get emergency care.  Stay up to date on your  immunizations.  When you have an episode of bronchospasm, stay calm. Try to relax and breathe more slowly.  If you have asthma, make sure you have an asthma action plan.  Keep all follow-up visits as told by your health care provider. This is important. Contact a health care provider if:  You have muscle aches.  You have chest pain.  The mucus that you cough up (sputum) changes from clear or white to yellow, green, gray, or bloody.  You have a fever.  Your sputum gets thicker. Get help right away if:  Your wheezing and coughing get worse, even after you take your prescribed medicines.  It gets even harder to breathe.  You develop severe chest pain. Summary  Bronchospasm is a tightening of the airways going into the lungs.  During an episode of bronchospasm, you may have a harder time breathing. You may cough and make a whistling sound when you breathe (wheeze).  Avoid exposure to triggers such as smoke, dust, mold, animal dander, and fragrances.  When you have an episode of bronchospasm, stay calm. Try to relax and breathe more slowly. This information is not intended to replace advice given to you by your health care provider. Make sure you discuss any questions you have with your health care provider. Document Released: 10/06/2003 Document Revised: 09/29/2016 Document Reviewed: 09/29/2016 Elsevier Interactive Patient Education  2017 Elsevier Inc.  Ear Barotrauma Ear barotrauma is injury to the eardrum that is caused by a pressure difference between the inside and the outside of the eardrum. What are the causes? The pressure difference that causes ear barotrauma can result from various things, including:  Flying in an airplane.  Coming to the surface too quickly after scuba diving.  Going to higher altitudes quickly.  Having an ear infection.  Being too close to an explosion or blast.  Receiving a hard hit to your ear.  Undergoing rapid depressurization after  working in a pressurized chamber.  Having inflammation of your middle ear (barotitis media) caused by a blockage of the auditory tube (eustachian tube), which leads from the back of your nose to your eardrum. The blockage can result from a cold, an environmental allergy, or an upper respiratory infection.  What are the signs or symptoms?  Feeling that your ear is clogged.  Dizziness that feels like a spinning, rocking, or tumbling sensation.  Loss of balance.  Nausea.  Ringing in your ears.  Ear pain.  Bleeding from your ears.  Sudden partial or complete loss of hearing.  Headache. How is this diagnosed? Your health care provider may know what is wrong based on your medical history and your recent activity. A physical exam will be done, including a close exam of your ear. Blood tests and imaging tests may be done to check for other injuries. Hearing and balance tests may also be done. This may be more important if the ear barotrauma happened while scuba diving or if you traveled to a high altitude after diving. How is this treated? Treatment depends on the severity of the injury to your ear or ears. Home  care techniques may be recommended to help equalize pressure changes. These may include chewing gum or "popping" your ears. A decongestant may be used to help reduce congestion. If there is a risk for infection, you may be given antibiotic medicine. With severe injuries, you may need to see a specialist. Surgery is sometimes needed, but this is rare. Follow these instructions at home:  Take medicines only as directed by your health care provider.  Do not do any of the following until your health care provider approves: ? Travel to high altitudes. ? Work in a Estate agentpressurized cabin or room. ? Scuba dive.  Use techniques to help equalize pressure changes in your ear as recommended by your health care provider. These may include: ? Chewing gum. ? Frequent, forceful  swallowing. ? Holding your nose and gently blowing to pop your ears.  Keep your ears dry. Use the corner of a towel to get water out of your ears after showering or bathing.  Keep all follow-up visits as directed by your health care provider. This is important. Contact a health care provider if:  Your symptoms do not improve, or they become worse.  You have a fever.  Your symptoms involve only one ear. Get help right away if:  You develop a severe headache, dizziness, or severe ear pain.  You have bloody or puslike drainage from your ears.  You have hearing loss.  Your outer ear becomes red or swollen, or there is swelling behind your earlobe. This information is not intended to replace advice given to you by your health care provider. Make sure you discuss any questions you have with your health care provider. Document Released: 09/05/2006 Document Revised: 03/10/2016 Document Reviewed: 05/19/2014 Elsevier Interactive Patient Education  2018 [employer]  [employer] Barotitis media is inflammation of the middle ear. This condition occurs when an auditory tube (eustachian tube) is blocked in one or both ears. These tubes lead from the middle ear to the back of the nose (nasopharynx). This condition typically occurs when you experience changes in pressure, such as when flying or scuba diving. Untreated barotitis media may lead to damage or hearing loss (barotrauma), which may become permanent. What are the causes? This condition may be caused by changes in air pressure from:  Flying.  Scuba diving.  A nearby explosion.  What increases the risk? The following factors may make you more likely to develop this condition:  Middle ear infection.  Sinus infection.  A cold.  Environmental allergies.  Small eustachian tubes.  Recent ear surgery.  What are the signs or symptoms? Symptoms of this condition may include:  Ear pain.  Hearing loss.  In severe cases,  symptoms can include:  Dizziness and nausea (vertigo).  Temporary facial paralysis.  How is this diagnosed? This condition is diagnosed based on:  A physical exam. Your health care provider may: ? Use a device (otoscope) to look into your ear canal and check your eardrum. ? Do a test that changes air pressure in the middle ear to check how well the eardrum moves and to see if the eustachian tube is working(tympanogram).  Your medical history.  In some cases, your health care provider may have you take a hearing test. You may also be referred to someone who specializes in ear treatment (otolaryngologist, "ENT"). How is this treated? This condition may be treated with:  Medicines to relieve congestion in your nose, sinus, or upper respiratory tract (decongestants).  Techniques to equalize pressure (to "pop"  your ears), such as: ? Yawning. ? Chewing gum. ? Swallowing.  In severe cases, you may need surgery to relieve your symptoms or to prevent future inflammation. Follow these instructions at home:  Take over-the-counter and prescription medicines only as told by your health care provider.  Do not put anything into your ears to clean or unplug them. Ear drops will not help.  Keep all follow-up visits as told by your health care provider. This is important. How is this prevented? Using these strategies may help to prevent barotitis media:  Chewing gum with frequent, forceful swallowing during takeoff and landing when flying.  Holding your nose and gently blowing to pop your ears for equalizing pressure changes. This forces air into the eustachian tube.  Yawning during air pressure changes.  Using a nasal decongestant about 30-60 minutes before flying, if you have nasal congestion.  Contact a health care provider if:  You have vertigo.  You have hearing loss.  Your symptoms do not get better or they get worse.  You have a fever. Get help right away if:  You have a  severe headache, ear pain, and dizziness.  You have balance problems.  You cannot move or feel part of your face.  You have bloody or pus-like drainage from your ears. Summary  Barotitis media is inflammation of the middle ear.  This condition typically occurs when you experience changes in pressure, such as when flying or scuba diving.  You may be at a higher risk for this condition if you have small eustachian tubes, had recent ear surgery, or have allergies, a cold, or sinus or middle ear infection.  This condition may be treated with medicines or techniques to equalize pressure in your ears.  Strategies can be used to help prevent barotitis media. This information is not intended to replace advice given to you by your health care provider. Make sure you discuss any questions you have with your health care provider. Document Released: 09/30/2000 Document Revised: 08/22/2016 Document Reviewed: 08/22/2016 Elsevier Interactive Patient Education  2017 [employer].

## 2017-04-04 LAB — COMPREHENSIVE METABOLIC PANEL
ALBUMIN: 4.4 g/dL (ref 3.5–5.5)
ALT: 17 IU/L (ref 0–32)
AST: 16 IU/L (ref 0–40)
Albumin/Globulin Ratio: 1.8 (ref 1.2–2.2)
Alkaline Phosphatase: 73 IU/L (ref 39–117)
BUN / CREAT RATIO: 11 (ref 9–23)
BUN: 9 mg/dL (ref 6–20)
Bilirubin Total: 0.3 mg/dL (ref 0.0–1.2)
CALCIUM: 9.4 mg/dL (ref 8.7–10.2)
CO2: 25 mmol/L (ref 20–29)
CREATININE: 0.82 mg/dL (ref 0.57–1.00)
Chloride: 102 mmol/L (ref 96–106)
GFR calc non Af Amer: 92 mL/min/{1.73_m2} (ref >59)
GFR, EST AFRICAN AMERICAN: 106 mL/min/{1.73_m2} (ref >59)
GLUCOSE: 84 mg/dL (ref 65–99)
Globulin, Total: 2.4 g/dL (ref 1.5–4.5)
Potassium: 4 mmol/L (ref 3.5–5.2)
Sodium: 140 mmol/L (ref 134–144)
TOTAL PROTEIN: 6.8 g/dL (ref 6.0–8.5)

## 2017-04-04 LAB — C-REACTIVE PROTEIN: CRP: 4.1 mg/L (ref 0.0–4.9)

## 2017-04-20 ENCOUNTER — Other Ambulatory Visit: Payer: Self-pay | Admitting: Family Medicine

## 2017-04-22 ENCOUNTER — Telehealth: Payer: Self-pay | Admitting: *Deleted

## 2017-04-22 NOTE — Telephone Encounter (Signed)
Faxed to [facility] [address]  medication [facility]. Confirmation page received at 4:42p

## 2017-05-15 ENCOUNTER — Ambulatory Visit (INDEPENDENT_AMBULATORY_CARE_PROVIDER_SITE_OTHER): Payer: Commercial Managed Care - PPO

## 2017-05-15 ENCOUNTER — Ambulatory Visit (INDEPENDENT_AMBULATORY_CARE_PROVIDER_SITE_OTHER): Payer: Commercial Managed Care - PPO | Admitting: Family Medicine

## 2017-05-15 ENCOUNTER — Encounter: Payer: Self-pay | Admitting: Family Medicine

## 2017-05-15 VITALS — BP 119/84 | HR 86 | Temp 98.3°F | Resp 18 | Ht 62.0 in | Wt 150.6 lb

## 2017-05-15 DIAGNOSIS — R519 Headache, unspecified: Secondary | ICD-10-CM

## 2017-05-15 DIAGNOSIS — J189 Pneumonia, unspecified organism: Secondary | ICD-10-CM

## 2017-05-15 DIAGNOSIS — R51 Headache: Secondary | ICD-10-CM | POA: Diagnosis not present

## 2017-05-15 DIAGNOSIS — J181 Lobar pneumonia, unspecified organism: Secondary | ICD-10-CM

## 2017-05-15 MED ORDER — SERTRALINE HCL 100 MG PO TABS: 100.0000 mg | ORAL_TABLET | Freq: Every day | ORAL | 1 refills | Status: DC

## 2017-05-15 MED ORDER — BUTALBITAL-ASPIRIN-CAFFEINE 50-325-40 MG PO CAPS: 1.0000 | ORAL_CAPSULE | Freq: Four times a day (QID) | ORAL | 1 refills | Status: DC | PRN

## 2017-05-15 MED ORDER — TIZANIDINE HCL 4 MG PO CAPS: 4.0000 mg | ORAL_CAPSULE | Freq: Every day | ORAL | 2 refills | Status: DC

## 2017-05-15 NOTE — Patient Instructions (Signed)
     IF you received an x-ray today, you will receive an invoice from [facility]. Please contact [facility] at [phone] with questions or concerns regarding your invoice.   IF you received labwork today, you will receive an invoice from [facility]. Please contact [facility] at [phone] with questions or concerns regarding your invoice.   Our billing staff will not be able to assist you with questions regarding bills from these companies.  You will be contacted with the lab results as soon as they are available. The fastest way to get your results is to activate your My Chart account. Instructions are located on the last page of this paperwork. If you have not heard from us regarding the results in 2 weeks, please contact this office.     

## 2017-05-15 NOTE — Progress Notes (Signed)
Subjective:    Patient ID: Wilmer Floor, female    DOB: [birth year], 36 y.o.   MRN: [id number] Chief Complaint  Patient presents with  . Other    Bronschospasm  . Follow-up    HPI Last peak flow  Rare occasional cough.  Ear is slowly getting better but still doesn't have great hearing out of there.   Still with headaches.  STill not sleep trained baby and eating a lot of sugar.   Over the next 3 weeks lots of traveling but going to then work on sleep training which should help.   Fiorinal every other day to every day alternating with tylenol arthritis which doesn't work at all.  SHe isn't remember to take the mg daily but she is doing that.   Has tried the gabapentin which did not work Failed diclofenax, amiptriptyline, topamax, propranolol  mvi - and mag qhs - about 500u qhs  Past Medical History:  Diagnosis Date  . ADD (attention deficit disorder)   . GDM, class A1 04/06/2016  . Gestational diabetes mellitus (GDM), antepartum   . 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 Postpartum care following cesarean delivery (6/22) 04/07/2016   Past Surgical History:  Procedure Laterality Date  . CESAREAN SECTION N/A 04/07/2016   Procedure: CESAREAN SECTION;  Surgeon: Shea Evans, MD;  Location: [facility] BIRTHING SUIT[facility];  Service: Obstetrics;  Laterality: N/A;  . MOLE REMOVAL    . WISDOM TOOTH EXTRACTION     Current Outpatient Prescriptions on File Prior to Visit  Medication Sig Dispense Refill  . acetaminophen (TYLENOL) 500 MG tablet Take 1,000 mg by mouth every 6 (six) hours as needed for moderate pain or headache.     . Prenatal Vit-Fe Fumarate-FA (PRENATAL VITAMINS PLUS) 27-1 MG TABS Take 1 tablet by mouth daily.     No current facility-administered medications on file prior to visit.    Allergies  Allergen Reactions  . Amoxicillin Anaphylaxis    Has patient had a PCN reaction causing immediate rash, facial/tongue/throat swelling, SOB or lightheadedness with hypotension: Yes Has patient had  a PCN reaction causing severe rash involving mucus membranes or skin necrosis: No Has patient had a PCN reaction that required hospitalization Yes Has patient had a PCN reaction occurring within the last 10 years: No If all of the above answers are "NO", then may proceed with Cephalosporin use.    Family History  Problem Relation Age of Onset  . Hyperlipidemia Mother   . 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 Hemophilia Cousin        maternal female cousin  . Hemophilia Cousin        maternal female cousin  . Diabetes Maternal Grandmother    Social History   Social History  . Marital status: Married    Spouse name: N/A  . Number of children: N/A  . Years of education: N/A   Social History Main Topics  . Smoking status: Never Smoker  . Smokeless tobacco: Never Used  . Alcohol use No     Comment: occasional   . Drug use: No  . Sexual activity: Yes   Other Topics Concern  . None   Social History Narrative  . None   Depression screen [facility] 2/9 05/15/2017 04/03/2017 03/02/2017 02/25/2017 02/23/2017  Decreased Interest 0 0 0 0 0  Down, Depressed, Hopeless 0 0 0 0 0  PHQ - 2 Score 0 0 0 0 0  Altered sleeping - - - - -  Tired, decreased energy - - - - -  Change in appetite - - - - -  Feeling bad or failure about yourself  - - - - -  Trouble concentrating - - - - -  Moving slowly or fidgety/restless - - - - -  Suicidal thoughts - - - - -  PHQ-9 Score - - - - -  Difficult doing work/chores - - - - -    Review of Systems See hpi    Objective:   Physical Exam  Constitutional: She is oriented to person, place, and time. She appears well-developed and well-nourished. No distress.  HENT:  Head: Normocephalic and atraumatic.  Right Ear: External ear normal.  Left Ear: External ear normal.  Eyes: Conjunctivae are normal. No scleral icterus.  Neck: Normal range of motion. Neck supple. No thyromegaly present.  Cardiovascular: Normal rate, regular rhythm, normal heart sounds  and intact distal pulses.   Pulmonary/Chest: Effort normal and breath sounds normal. No respiratory distress.  Musculoskeletal: She exhibits no edema.  Lymphadenopathy:    She has no cervical adenopathy.  Neurological: She is alert and oriented to person, place, and time.  Skin: Skin is warm and dry. She is not diaphoretic. No erythema.  Psychiatric: She has a normal mood and affect. Her behavior is normal.    Last peak flow 380 - predicted peak flow 479 L/min   BP 119/84   Pulse 86   Temp 98.3 F (36.8 C) (Oral)   Resp 18   Ht 5\' 2"  (1.575 m)   Wt 150 lb 9.6 oz (68.3 kg)   LMP 04/24/2017 (Approximate)   SpO2 99%   PF 410 L/min   Breastfeeding? Yes   BMI 27.55 kg/m    Dg Chest 2 View  Result Date: 05/15/2017 CLINICAL DATA:  36 year old female -followup left pneumonia EXAM: CHEST  2 VIEW COMPARISON:  04/03/2017 and prior radiographs FINDINGS: The cardiomediastinal silhouette is unremarkable. There is no evidence of focal airspace disease, pulmonary edema, suspicious pulmonary nodule/mass, pleural effusion, or pneumothorax. No acute bony abnormalities are identified. Left lower lobe opacity has resolved. IMPRESSION: No evidence of active cardiopulmonary disease. Resolved left lower lobe opacity. Electronically Signed   By: Harmon PierJeffrey  Hu M.D.   On: 05/15/2017 11:34    Assessment & Plan:  Consider trying lyrica/pregabalin, flailed gabapentin 1. Pneumonia of left lower lobe due to infectious organism (HCC)   2. Chronic daily headache - concern she is developed med rebound HA from APAP and fiorinol - start zanaflex qhs for daily prophylactic for sev wks and then in a month or so she will try to go off apap and fiorinol to try for a wash out period as her HAs were better prior to being ill w/ pna - I think her severe prolonged illness and cough syrup might have ramped them up and now rebound - pt agrees and also wants to try to eliminate sugar from her diet - do a med/sugar cleanse for sev  wks. May need to try tramadol for HAs during that time.  Rec trying herbal supplements during that time as well like riboflavin, butterbur, feverfew, melatonin  (see note 1/29 for prophylactic list)  Orders Placed This Encounter  Procedures  . DG Chest 2 View    Standing Status:   Future    Number of Occurrences:   1    Standing Expiration Date:   05/15/2018    Order Specific Question:   Reason for Exam (SYMPTOM  OR DIAGNOSIS REQUIRED)  Answer:   f/u left lower lobe atelectasis vs scarring after severe pneumonia    Order Specific Question:   Is the patient pregnant?    Answer:   No    Order Specific Question:   Preferred imaging location?    Answer:   External  . Care order/instruction:    Scheduling Instructions:     Peak Flow (IF NEB IS ORDERED PLEASE DO BEFORE AND AFTER NEB)    Meds ordered this encounter  Medications  . tiZANidine (ZANAFLEX) 4 MG capsule    Sig: Take 1 capsule (4 mg total) by mouth at bedtime.    Dispense:  30 capsule    Refill:  2  . butalbital-aspirin-caffeine (FIORINAL) 50-325-40 MG capsule    Sig: Take 1-2 capsules by mouth every 6 (six) hours as needed for headache.    Dispense:  60 capsule    Refill:  1    Not to exceed 2 additional fills before 09/20/2017  . sertraline (ZOLOFT) 100 MG tablet    Sig: Take 1 tablet (100 mg total) by mouth daily.    Dispense:  90 tablet    Refill:  1     Norberto SorensonEva Staria Birkhead, M.D.  Primary Care at [facility]  [place] [address] [place], [place] [address] [phone] phone [phone] fax  05/16/17 7:58 PM

## 2017-06-01 ENCOUNTER — Ambulatory Visit (INDEPENDENT_AMBULATORY_CARE_PROVIDER_SITE_OTHER): Payer: Commercial Managed Care - PPO | Admitting: Family Medicine

## 2017-06-01 ENCOUNTER — Encounter: Payer: Self-pay | Admitting: Family Medicine

## 2017-06-01 VITALS — BP 110/78 | HR 88 | Temp 98.1°F | Resp 18 | Ht 62.36 in | Wt 148.6 lb

## 2017-06-01 DIAGNOSIS — Z136 Encounter for screening for cardiovascular disorders: Secondary | ICD-10-CM | POA: Diagnosis not present

## 2017-06-01 DIAGNOSIS — Z0000 Encounter for general adult medical examination without abnormal findings: Secondary | ICD-10-CM

## 2017-06-01 DIAGNOSIS — Z1383 Encounter for screening for respiratory disorder NEC: Secondary | ICD-10-CM

## 2017-06-01 DIAGNOSIS — Z8632 Personal history of gestational diabetes: Secondary | ICD-10-CM | POA: Diagnosis not present

## 2017-06-01 DIAGNOSIS — Z1389 Encounter for screening for other disorder: Secondary | ICD-10-CM

## 2017-06-01 DIAGNOSIS — Z1329 Encounter for screening for other suspected endocrine disorder: Secondary | ICD-10-CM | POA: Diagnosis not present

## 2017-06-01 DIAGNOSIS — Z130 Encounter for screening for diseases of the blood and blood-forming organs and certain disorders involving the immune mechanism: Secondary | ICD-10-CM

## 2017-06-01 MED ORDER — PROPRANOLOL HCL 10 MG PO TABS: 10.0000 mg | ORAL_TABLET | Freq: Four times a day (QID) | ORAL | 1 refills | Status: DC | PRN

## 2017-06-01 NOTE — Patient Instructions (Addendum)
Rec trying herbal supplements during that time that you are trying to go off the Tylenol arthritis and Fioricet to get the medication washed out - like riboflavin, butterbur, feverfew, melatonin.    IF you received an x-ray today, you will receive an invoice from [facility]. Please contact [facility] at [phone] with questions or concerns regarding your invoice.   IF you received labwork today, you will receive an invoice from [facility]. Please contact [facility] at [phone] with questions or concerns regarding your invoice.   Our billing staff will not be able to assist you with questions regarding bills from these companies.  You will be contacted with the lab results as soon as they are available. The fastest way to get your results is to activate your My Chart account. Instructions are located on the last page of this paperwork. If you have not heard from [place] regarding the results in 2 weeks, please contact this office.    To Whom It May Concern:  This patient was screened for tuberculosis using current guidelines from the Centers for Disease Control, and does not have tuberculosis in the communicable form.   Health Maintenance, Female Adopting a healthy lifestyle and getting preventive care can go a long way to promote health and wellness. Talk with your health care provider about what schedule of regular examinations is right for you. This is a good chance for you to check in with your provider about disease prevention and staying healthy. In between checkups, there are plenty of things you can do on your own. Experts have done a lot of research about which lifestyle changes and preventive measures are most likely to keep you healthy. Ask your health care provider for more information. Weight and diet Eat a healthy diet  Be sure to include plenty of vegetables, fruits, low-fat dairy products, and lean protein.  Do not eat a lot of foods high in solid fats,  added sugars, or salt.  Get regular exercise. This is one of the most important things you can do for your health. ? Most adults should exercise for at least 150 minutes each week. The exercise should increase your heart rate and make you sweat (moderate-intensity exercise). ? Most adults should also do strengthening exercises at least twice a week. This is in addition to the moderate-intensity exercise.  Maintain a healthy weight  Body mass index (BMI) is a measurement that can be used to identify possible weight problems. It estimates body fat based on height and weight. Your health care provider can help determine your BMI and help you achieve or maintain a healthy weight.  For females 5 years of age and older: ? A BMI below 18.5 is considered underweight. ? A BMI of 18.5 to 24.9 is normal. ? A BMI of 25 to 29.9 is considered overweight. ? A BMI of 30 and above is considered obese.  Watch levels of cholesterol and blood lipids  You should start having your blood tested for lipids and cholesterol at 37 years of age, then have this test every 5 years.  You may need to have your cholesterol levels checked more often if: ? Your lipid or cholesterol levels are high. ? You are older than 37 years of age. ? You are at high risk for heart disease.  Cancer screening Lung Cancer  Lung cancer screening is recommended for adults 71-55 years old who are at high risk for lung cancer because of a history of smoking.  A yearly low-dose CT  scan of the lungs is recommended for people who: ? Currently smoke. ? Have quit within the past 15 years. ? Have at least a 30-pack-year history of smoking. A pack year is smoking an average of one pack of cigarettes a day for 1 year.  Yearly screening should continue until it has been 15 years since you quit.  Yearly screening should stop if you develop a health problem that would prevent you from having lung cancer treatment.  Breast Cancer  Practice  breast self-awareness. This means understanding how your breasts normally appear and feel.  It also means doing regular breast self-exams. Let your health care provider know about any changes, no matter how small.  If you are in your 20s or 30s, you should have a clinical breast exam (CBE) by a health care provider every 1-3 years as part of a regular health exam.  If you are 70 or older, have a CBE every year. Also consider having a breast X-ray (mammogram) every year.  If you have a family history of breast cancer, talk to your health care provider about genetic screening.  If you are at high risk for breast cancer, talk to your health care provider about having an MRI and a mammogram every year.  Breast cancer gene (BRCA) assessment is recommended for women who have family members with BRCA-related cancers. BRCA-related cancers include: ? Breast. ? Ovarian. ? Tubal. ? Peritoneal cancers.  Results of the assessment will determine the need for genetic counseling and BRCA1 and BRCA2 testing.  Cervical Cancer Your health care provider may recommend that you be screened regularly for cancer of the pelvic organs (ovaries, uterus, and vagina). This screening involves a pelvic examination, including checking for microscopic changes to the surface of your cervix (Pap test). You may be encouraged to have this screening done every 3 years, beginning at age 75.  For women ages 76-65, health care providers may recommend pelvic exams and Pap testing every 3 years, or they may recommend the Pap and pelvic exam, combined with testing for human papilloma virus (HPV), every 5 years. Some types of HPV increase your risk of cervical cancer. Testing for HPV may also be done on women of any age with unclear Pap test results.  Other health care providers may not recommend any screening for nonpregnant women who are considered low risk for pelvic cancer and who do not have symptoms. Ask your health care provider  if a screening pelvic exam is right for you.  If you have had past treatment for cervical cancer or a condition that could lead to cancer, you need Pap tests and screening for cancer for at least 20 years after your treatment. If Pap tests have been discontinued, your risk factors (such as having a new sexual partner) need to be reassessed to determine if screening should resume. Some women have medical problems that increase the chance of getting cervical cancer. In these cases, your health care provider may recommend more frequent screening and Pap tests.  Colorectal Cancer  This type of cancer can be detected and often prevented.  Routine colorectal cancer screening usually begins at 37 years of age and continues through 37 years of age.  Your health care provider may recommend screening at an earlier age if you have risk factors for colon cancer.  Your health care provider may also recommend using home test kits to check for hidden blood in the stool.  A small camera at the end of a tube can  be used to examine your colon directly (sigmoidoscopy or colonoscopy). This is done to check for the earliest forms of colorectal cancer.  Routine screening usually begins at age 64.  Direct examination of the colon should be repeated every 5-10 years through 37 years of age. However, you may need to be screened more often if early forms of precancerous polyps or small growths are found.  Skin Cancer  Check your skin from head to toe regularly.  Tell your health care provider about any new moles or changes in moles, especially if there is a change in a mole's shape or color.  Also tell your health care provider if you have a mole that is larger than the size of a pencil eraser.  Always use sunscreen. Apply sunscreen liberally and repeatedly throughout the day.  Protect yourself by wearing long sleeves, pants, a wide-brimmed hat, and sunglasses whenever you are outside.  Heart disease,  diabetes, and high blood pressure  High blood pressure causes heart disease and increases the risk of stroke. High blood pressure is more likely to develop in: ? People who have blood pressure in the high end of the normal range (130-139/85-89 mm Hg). ? People who are overweight or obese. ? People who are African American.  If you are 20-23 years of age, have your blood pressure checked every 3-5 years. If you are 63 years of age or older, have your blood pressure checked every year. You should have your blood pressure measured twice-once when you are at a hospital or clinic, and once when you are not at a hospital or clinic. Record the average of the two measurements. To check your blood pressure when you are not at a hospital or clinic, you can use: ? An automated blood pressure machine at a pharmacy. ? A home blood pressure monitor.  If you are between 5 years and 65 years old, ask your health care provider if you should take aspirin to prevent strokes.  Have regular diabetes screenings. This involves taking a blood sample to check your fasting blood sugar level. ? If you are at a normal weight and have a low risk for diabetes, have this test once every three years after 37 years of age. ? If you are overweight and have a high risk for diabetes, consider being tested at a younger age or more often. Preventing infection Hepatitis B  If you have a higher risk for hepatitis B, you should be screened for this virus. You are considered at high risk for hepatitis B if: ? You were born in a country where hepatitis B is common. Ask your health care provider which countries are considered high risk. ? Your parents were born in a high-risk country, and you have not been immunized against hepatitis B (hepatitis B vaccine). ? You have HIV or AIDS. ? You use needles to inject street drugs. ? You live with someone who has hepatitis B. ? You have had sex with someone who has hepatitis B. ? You get  hemodialysis treatment. ? You take certain medicines for conditions, including cancer, organ transplantation, and autoimmune conditions.  Hepatitis C  Blood testing is recommended for: ? Everyone born from [birth year] through 1966. ? Anyone with known risk factors for hepatitis C.  Sexually transmitted infections (STIs)  You should be screened for sexually transmitted infections (STIs) including gonorrhea and chlamydia if: ? You are sexually active and are younger than 37 years of age. ? You are older than 37 years  of age and your health care provider tells you that you are at risk for this type of infection. ? Your sexual activity has changed since you were last screened and you are at an increased risk for chlamydia or gonorrhea. Ask your health care provider if you are at risk.  If you do not have HIV, but are at risk, it may be recommended that you take a prescription medicine daily to prevent HIV infection. This is called pre-exposure prophylaxis (PrEP). You are considered at risk if: ? You are sexually active and do not regularly use condoms or know the HIV status of your partner(s). ? You take drugs by injection. ? You are sexually active with a partner who has HIV.  Talk with your health care provider about whether you are at high risk of being infected with HIV. If you choose to begin PrEP, you should first be tested for HIV. You should then be tested every 3 months for as long as you are taking PrEP. Pregnancy  If you are premenopausal and you may become pregnant, ask your health care provider about preconception counseling.  If you may become pregnant, take 400 to 800 micrograms (mcg) of folic acid every day.  If you want to prevent pregnancy, talk to your health care provider about birth control (contraception). Osteoporosis and menopause  Osteoporosis is a disease in which the bones lose minerals and strength with aging. This can result in serious bone fractures. Your risk for  osteoporosis can be identified using a bone density scan.  If you are 56 years of age or older, or if you are at risk for osteoporosis and fractures, ask your health care provider if you should be screened.  Ask your health care provider whether you should take a calcium or vitamin D supplement to lower your risk for osteoporosis.  Menopause may have certain physical symptoms and risks.  Hormone replacement therapy may reduce some of these symptoms and risks. Talk to your health care provider about whether hormone replacement therapy is right for you. Follow these instructions at home:  Schedule regular health, dental, and eye exams.  Stay current with your immunizations.  Do not use any tobacco products including cigarettes, chewing tobacco, or electronic cigarettes.  If you are pregnant, do not drink alcohol.  If you are breastfeeding, limit how much and how often you drink alcohol.  Limit alcohol intake to no more than 1 drink per day for nonpregnant women. One drink equals 12 ounces of beer, 5 ounces of wine, or 1 ounces of hard liquor.  Do not use street drugs.  Do not share needles.  Ask your health care provider for help if you need support or information about quitting drugs.  Tell your health care provider if you often feel depressed.  Tell your health care provider if you have ever been abused or do not feel safe at home. This information is not intended to replace advice given to you by your health care provider. Make sure you discuss any questions you have with your health care provider. Document Released: 04/18/2011 Document Revised: 03/10/2016 Document Reviewed: 07/07/2015 Elsevier Interactive Patient Education  [employer].

## 2017-06-01 NOTE — Progress Notes (Signed)
Patient ID: Dominique Powers, female   DOB: August 05, 1981, 36 y.o.   MRN: [id number]  Tuberculosis Risk Questionnaire  1. No Were you born outside the [place] in one of the following parts of the world: [place], [place], [place], [place] or [place]?    2. No Have you traveled outside the [place] and lived for more than one month in one of the following parts of the world: [place], [place], [place], [place] or [place]?    3. No Do you have a compromised immune system such as from any of the following conditions:HIV/AIDS, organ or bone marrow transplantation, diabetes, immunosuppressive medicines (e.g. Prednisone, Remicaide), leukemia, lymphoma, cancer of the head or neck, gastrectomy or jejunal bypass, end-stage renal disease (on dialysis), or silicosis?     4. No Have you ever or do you plan on working in: a residential care center, a health care facility, a jail or prison or homeless shelter?    5. No Have you ever: injected illegal drugs, used crack cocaine, lived in a homeless shelter  or been in jail or prison?     6. No Have you ever been exposed to anyone with infectious tuberculosis?    Tuberculosis Symptom Questionnaire  Do you currently have any of the following symptoms?  1. No Unexplained cough lasting more than 3 weeks?   2. No Unexplained fever lasting more than 3 weeks.   3. No Night Sweats (sweating that leaves the bedclothes and sheets wet)     4. No Shortness of Breath   5. No Chest Pain   6. No Unintentional weight loss    7. No Unexplained fatigue (very tired for no reason)

## 2017-06-01 NOTE — Progress Notes (Signed)
Subjective:    Patient ID: Dominique Powers, female    DOB: [birth year], 36 y.o.   MRN: [id number] Chief Complaint  Patient presents with  . Annual Exam    HPI  Primary Preventative Screenings: Cervical Cancer:  Family Planning:  IUD - copper - more cramping and heavy bleeding but shorter and irregular.  STI screening: Breast Cancer: Colorectal Cancer: Tobacco use: Bone Density: Cardiac: Weight/blood sugar: OTC/vit/supp/herbal:  Diet/Exercise/EtOH/substances: Dentist/Optho: dentist yesterday Immunizations:   start zanaflex qhs for daily prophylactic for sev wks and then in a month or so she will try to go off apap and fiorinol to try for a wash out period as her HAs were better prior to being ill w/ pna - I think her severe prolonged illness and cough syrup might have ramped them up and now rebound - pt agrees and also wants to try to eliminate sugar from her diet - do a med/sugar cleanse for sev wks. May need to try tramadol for HAs during that time.  Rec trying herbal supplements during that time as well like riboflavin, butterbur, feverfew, melatonin  Past Medical History:  Diagnosis Date  . ADD (attention deficit disorder)   . GDM, class A1 04/06/2016  . Gestational diabetes mellitus (GDM), antepartum   . 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 Postpartum care following cesarean delivery (6/22) 04/07/2016   Past Surgical History:  Procedure Laterality Date  . CESAREAN SECTION N/A 04/07/2016   Procedure: CESAREAN SECTION;  Surgeon: Shea Evans, MD;  Location: [facility] BIRTHING SUITES;  Service: Obstetrics;  Laterality: N/A;  . MOLE REMOVAL    . WISDOM TOOTH EXTRACTION     Current Outpatient Prescriptions on File Prior to Visit  Medication Sig Dispense Refill  . acetaminophen (TYLENOL) 500 MG tablet Take 1,000 mg by mouth every 6 (six) hours as needed for moderate pain or headache.     . butalbital-aspirin-caffeine (FIORINAL) 50-325-40 MG capsule Take 1-2 capsules by mouth every 6 (six)  hours as needed for headache. 60 capsule 1  . Prenatal Vit-Fe Fumarate-FA (PRENATAL VITAMINS PLUS) 27-1 MG TABS Take 1 tablet by mouth daily.    . sertraline (ZOLOFT) 100 MG tablet Take 1 tablet (100 mg total) by mouth daily. 90 tablet 1  . tiZANidine (ZANAFLEX) 4 MG capsule Take 1 capsule (4 mg total) by mouth at bedtime. (Patient not taking: Reported on 06/01/2017) 30 capsule 2   No current facility-administered medications on file prior to visit.    Allergies  Allergen Reactions  . Amoxicillin Anaphylaxis    Has patient had a PCN reaction causing immediate rash, facial/tongue/throat swelling, SOB or lightheadedness with hypotension: Yes Has patient had a PCN reaction causing severe rash involving mucus membranes or skin necrosis: No Has patient had a PCN reaction that required hospitalization Yes Has patient had a PCN reaction occurring within the last 10 years: No If all of the above answers are "NO", then may proceed with Cephalosporin use.    Family History  Problem Relation Age of Onset  . Hyperlipidemia Mother   . 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 Hemophilia Cousin        maternal female cousin  . Hemophilia Cousin        maternal female cousin  . Diabetes Maternal Grandmother    Social History   Social History  . Marital status: Married    Spouse name: N/A  . Number of children: N/A  . Years of education: N/A  Social History Main Topics  . Smoking status: Never Smoker  . Smokeless tobacco: Never Used  . Alcohol use 0.0 oz/week     Comment: occasional   . Drug use: No  . Sexual activity: Yes   Other Topics Concern  . None   Social History Narrative  . None   Depression screen [facility] 2/9 06/01/2017 05/15/2017 04/03/2017 03/02/2017 02/25/2017  Decreased Interest 0 0 0 0 0  Down, Depressed, Hopeless 0 0 0 0 0  PHQ - 2 Score 0 0 0 0 0  Altered sleeping - - - - -  Tired, decreased energy - - - - -  Change in appetite - - - - -  Feeling bad or failure about  yourself  - - - - -  Trouble concentrating - - - - -  Moving slowly or fidgety/restless - - - - -  Suicidal thoughts - - - - -  PHQ-9 Score - - - - -  Difficult doing work/chores - - - - -    Review of Systems See hpi    Objective:   Physical Exam  Constitutional: She is oriented to person, place, and time. She appears well-developed and well-nourished. No distress.  HENT:  Head: Normocephalic and atraumatic.  Right Ear: Tympanic membrane, external ear and ear canal normal.  Left Ear: Tympanic membrane, external ear and ear canal normal.  Nose: Nose normal. No mucosal edema or rhinorrhea.  Mouth/Throat: Uvula is midline, oropharynx is clear and moist and mucous membranes are normal. No posterior oropharyngeal erythema.  Eyes: Pupils are equal, round, and reactive to light. Conjunctivae and EOM are normal. Right eye exhibits no discharge. Left eye exhibits no discharge. No scleral icterus.  Neck: Normal range of motion. Neck supple. No thyromegaly present.  Cardiovascular: Normal rate, regular rhythm, normal heart sounds and intact distal pulses.   Pulmonary/Chest: Effort normal and breath sounds normal. No respiratory distress.  Abdominal: Soft. Bowel sounds are normal. There is no tenderness.  Genitourinary: No breast swelling, tenderness, discharge or bleeding.  Musculoskeletal: She exhibits no edema.  Lymphadenopathy:    She has no cervical adenopathy.  Neurological: She is alert and oriented to person, place, and time. She has normal reflexes.  Skin: Skin is warm and dry. She is not diaphoretic. No erythema.  Psychiatric: She has a normal mood and affect. Her behavior is normal.      BP 110/78 (BP Location: Right Arm, Patient Position: Sitting, Cuff Size: Normal)   Pulse 88   Temp 98.1 F (36.7 C) (Oral)   Resp 18   Ht 5' 2.36" (1.584 m)   Wt 148 lb 9.6 oz (67.4 kg)   LMP 05/22/2017 (Approximate)   SpO2 98%   Breastfeeding? Yes   BMI 26.86 kg/m    Visual Acuity  Screening   Right eye Left eye Both eyes  Without correction:     With correction: 20/30 20/40 20/25     Assessment & Plan:   1. Annual physical exam   2. Screening for cardiovascular, respiratory, and genitourinary diseases   3. Screening for deficiency anemia   4. Screening for thyroid disorder   5. History of gestational diabetes     Orders Placed This Encounter  Procedures  . Lipid panel    Order Specific Question:   Has the patient fasted?    Answer:   Yes  . Hemoglobin A1c  . TSH  . TB Skin Test    Standing Status:   Future  Standing Expiration Date:   10/01/2017    Order Specific Question:   Has patient ever tested positive?    Answer:   No    Meds ordered this encounter  Medications  . propranolol (INDERAL) 10 MG tablet    Sig: Take 1-2 tablets (10-20 mg total) by mouth 4 (four) times daily as needed.    Dispense:  90 tablet    Refill:  1    Norberto SorensonEva Ryane Konieczny, M.D.  [facility] at [facility]   [address] [place], [place] [address] [phone] phone [phone] fax  06/04/17 12:22 PM

## 2017-06-02 LAB — LIPID PANEL
CHOL/HDL RATIO: 2.8 ratio (ref 0.0–4.4)
Cholesterol, Total: 226 mg/dL — ABNORMAL HIGH (ref 100–199)
HDL: 81 mg/dL (ref >39)
LDL Calculated: 123 mg/dL — ABNORMAL HIGH (ref 0–99)
Triglycerides: 112 mg/dL (ref 0–149)
VLDL Cholesterol Cal: 22 mg/dL (ref 5–40)

## 2017-06-02 LAB — TSH: TSH: 2.07 u[IU]/mL (ref 0.450–4.500)

## 2017-06-02 LAB — HEMOGLOBIN A1C
ESTIMATED AVERAGE GLUCOSE: 105 mg/dL
HEMOGLOBIN A1C: 5.3 % (ref 4.8–5.6)

## 2017-06-13 ENCOUNTER — Telehealth: Payer: Self-pay | Admitting: Family Medicine

## 2017-06-13 MED ORDER — PROMETHAZINE HCL 25 MG PO TABS: 25.0000 mg | ORAL_TABLET | Freq: Four times a day (QID) | ORAL | 0 refills | Status: DC | PRN

## 2017-06-13 MED ORDER — SUMATRIPTAN SUCCINATE 100 MG PO TABS: 100.0000 mg | ORAL_TABLET | ORAL | 0 refills | Status: DC | PRN

## 2017-06-13 MED ORDER — TRAMADOL HCL 50 MG PO TABS: 50.0000 mg | ORAL_TABLET | Freq: Four times a day (QID) | ORAL | 0 refills | Status: DC | PRN

## 2017-06-13 MED ORDER — SUMATRIPTAN SUCCINATE 100 MG PO TABS: 100.0000 mg | ORAL_TABLET | Freq: Once | ORAL | 0 refills | Status: DC

## 2017-06-13 NOTE — Telephone Encounter (Signed)
Patient called noting severe, throbbing, semi-debilitating headache currently. She had a bad headache yesterday as well. She is taking propranolol 20 mg, Claritin, to Fiorinal, and 2 Advil today. She is currently having to lie down due to severity and it is worse when bending over. Feels like intense pressure. She was outside in the heat today but was sure to drink a lot of water.  Called in tramadol and sent over imitrex and promethazine to try.  If no improvement, need OV eval

## 2017-06-14 ENCOUNTER — Ambulatory Visit (INDEPENDENT_AMBULATORY_CARE_PROVIDER_SITE_OTHER): Payer: Commercial Managed Care - PPO | Admitting: Family Medicine

## 2017-06-14 ENCOUNTER — Encounter: Payer: Self-pay | Admitting: Family Medicine

## 2017-06-14 VITALS — BP 115/77 | HR 85 | Temp 98.2°F | Resp 16 | Ht 62.0 in | Wt 147.0 lb

## 2017-06-14 DIAGNOSIS — R519 Headache, unspecified: Secondary | ICD-10-CM

## 2017-06-14 DIAGNOSIS — G44201 Tension-type headache, unspecified, intractable: Secondary | ICD-10-CM | POA: Diagnosis not present

## 2017-06-14 DIAGNOSIS — R51 Headache: Secondary | ICD-10-CM | POA: Diagnosis not present

## 2017-06-14 DIAGNOSIS — R42 Dizziness and giddiness: Secondary | ICD-10-CM | POA: Diagnosis not present

## 2017-06-14 LAB — COMPREHENSIVE METABOLIC PANEL
ALK PHOS: 80 IU/L (ref 39–117)
ALT: 20 IU/L (ref 0–32)
AST: 20 IU/L (ref 0–40)
Albumin/Globulin Ratio: 1.6 (ref 1.2–2.2)
Albumin: 4.4 g/dL (ref 3.5–5.5)
BUN/Creatinine Ratio: 20 (ref 9–23)
BUN: 14 mg/dL (ref 6–20)
Bilirubin Total: 0.2 mg/dL (ref 0.0–1.2)
CALCIUM: 9.3 mg/dL (ref 8.7–10.2)
CO2: 22 mmol/L (ref 20–29)
CREATININE: 0.71 mg/dL (ref 0.57–1.00)
Chloride: 102 mmol/L (ref 96–106)
GFR calc Af Amer: 127 mL/min/{1.73_m2} (ref >59)
GFR, EST NON AFRICAN AMERICAN: 110 mL/min/{1.73_m2} (ref >59)
GLOBULIN, TOTAL: 2.8 g/dL (ref 1.5–4.5)
GLUCOSE: 117 mg/dL — AB (ref 65–99)
Potassium: 4.5 mmol/L (ref 3.5–5.2)
SODIUM: 136 mmol/L (ref 134–144)
Total Protein: 7.2 g/dL (ref 6.0–8.5)

## 2017-06-14 LAB — POCT CBC
GRANULOCYTE PERCENT: 52.1 % (ref 37–80)
HCT, POC: 42.8 % (ref 37.7–47.9)
Hemoglobin: 14.3 g/dL (ref 12.2–16.2)
Lymph, poc: 2 (ref 0.6–3.4)
MCH: 29.4 pg (ref 27–31.2)
MCHC: 33.4 g/dL (ref 31.8–35.4)
MCV: 87.9 fL (ref 80–97)
MID (CBC): 0.6 (ref 0–0.9)
MPV: 7.6 fL (ref 0–99.8)
PLATELET COUNT, POC: 232 10*3/uL (ref 142–424)
POC Granulocyte: 2.8 (ref 2–6.9)
POC LYMPH %: 37.5 % (ref 10–50)
POC MID %: 10.4 %M (ref 0–12)
RBC: 4.87 M/uL (ref 4.04–5.48)
RDW, POC: 13.3 %
WBC: 5.3 10*3/uL (ref 4.6–10.2)

## 2017-06-14 LAB — POCT SEDIMENTATION RATE: POCT SED RATE: 20 mm/hr (ref 0–22)

## 2017-06-14 MED ORDER — KETOROLAC TROMETHAMINE 30 MG/ML IJ SOLN: 30.0000 mg | Freq: Once | INTRAMUSCULAR | Status: AC

## 2017-06-14 MED ORDER — PREDNISONE 20 MG PO TABS: ORAL_TABLET | ORAL | 0 refills | Status: DC

## 2017-06-14 MED ORDER — DIPHENHYDRAMINE HCL 50 MG/ML IJ SOLN: 25.0000 mg | Freq: Once | INTRAMUSCULAR | Status: DC

## 2017-06-14 NOTE — Progress Notes (Signed)
Subjective:  By signing my name below, I, Dominique Powers, attest that this documentation has been prepared under the direction and in the presence of Dominique Sorenson, MD Electronically Signed: Charline Bills, ED Scribe 06/14/2017 at 12:27 PM.   Patient ID: Dominique Powers, female    DOB: [birth year], 36 y.o.   MRN: [id number]  Chief Complaint  Patient presents with  . Migraine   HPI  [name] is a 36 y.o. female who presents to Primary Care at [facility] complaining of a migraine, specifically behind her eyes and both sides of her temple, onset yesterday upon waking. Pt reports throbbing, pulsating HA that started yesterday morning that initially began as a sinus HA and increased with bending forward. States HA was worse yesterday but has improved ~50%. She noticed some nasal congestion and pressure behind the left ear a few days prior for which she had been treating with Claritin and nasal spray daily. She further reports symptoms of nausea and vomiting last night followed by light-headedness with ambulating. Pt reports resting most of the day yesterday and taking 2 Imitrex and Tramadol yesterday without relief. Also took Zoloft and Fiorinal. She also reports taking propranolol prior to going outside yesterday when she initially noticed light-headedness; states she is unsure if that caused her BP to drop too low. Denies fever, chills, visual disturbances, photophobia and sound sensitivity.   Past Medical History:  Diagnosis Date  . ADD (attention deficit disorder)   . GDM, class A1 04/06/2016  . Gestational diabetes mellitus (GDM), antepartum   . 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 Postpartum care following cesarean delivery (6/22) 04/07/2016   Current Outpatient Prescriptions on File Prior to Visit  Medication Sig Dispense Refill  . acetaminophen (TYLENOL) 500 MG tablet Take 1,000 mg by mouth every 6 (six) hours as needed for moderate pain or headache.     . butalbital-aspirin-caffeine (FIORINAL) 50-325-40  MG capsule Take 1-2 capsules by mouth every 6 (six) hours as needed for headache. 60 capsule 1  . Prenatal Vit-Fe Fumarate-FA (PRENATAL VITAMINS PLUS) 27-1 MG TABS Take 1 tablet by mouth daily.    . propranolol (INDERAL) 10 MG tablet Take 1-2 tablets (10-20 mg total) by mouth 4 (four) times daily as needed. 90 tablet 1  . sertraline (ZOLOFT) 100 MG tablet Take 1 tablet (100 mg total) by mouth daily. 90 tablet 1  . traMADol (ULTRAM) 50 MG tablet Take 1-2 tablets (50-100 mg total) by mouth every 6 (six) hours as needed. 40 tablet 0  . SUMAtriptan (IMITREX) 100 MG tablet Take 1 tablet (100 mg total) by mouth once. May repeat once in 2 hrs if HA persists/recurs. Do not use >2 tabs/24 hrs 10 tablet 0   No current facility-administered medications on file prior to visit.    Allergies  Allergen Reactions  . Amoxicillin Anaphylaxis    Has patient had a PCN reaction causing immediate rash, facial/tongue/throat swelling, SOB or lightheadedness with hypotension: Yes Has patient had a PCN reaction causing severe rash involving mucus membranes or skin necrosis: No Has patient had a PCN reaction that required hospitalization Yes Has patient had a PCN reaction occurring within the last 10 years: No If all of the above answers are "NO", then may proceed with Cephalosporin use.    Review of Systems  Constitutional: Negative for chills and fever.  HENT: Positive for congestion.   Eyes: Negative for photophobia and visual disturbance.  Neurological: Positive for headaches.      Objective:   Physical Exam  Constitutional: She is oriented to person, place, and time. She appears well-developed and well-nourished. No distress.  HENT:  Head: Normocephalic and atraumatic.  Right Ear: Tympanic membrane normal.  Left Ear: Tympanic membrane normal.  Mouth/Throat: Oropharynx is clear and moist and mucous membranes are normal.  Nose: Nares with erythema. Dry mucous membranes, R worse than L   Eyes: Conjunctivae  and EOM are normal.  Negative Dix Hallpike.  Neck: Neck supple. No tracheal deviation present. No thyromegaly present.  Cardiovascular: Normal rate.   Pulmonary/Chest: Effort normal. No respiratory distress.  Musculoskeletal: Normal range of motion.  Lymphadenopathy:    She has no cervical adenopathy.  Neurological: She is alert and oriented to person, place, and time. No cranial nerve deficit.  Cranial nerves intact. Cerebellar exam normal.  Skin: Skin is warm and dry.  Psychiatric: She has a normal mood and affect. Her behavior is normal.  Nursing note and vitals reviewed.   BP 115/77   Pulse 85   Temp 98.2 F (36.8 C) (Oral)   Resp 16   Ht 5\' 2"  (1.575 m)   Wt 147 lb (66.7 kg)   LMP 05/22/2017 (Approximate)   SpO2 95%   BMI 26.89 kg/m     Assessment & Plan:   1. Acute intractable tension-type headache   2. Lightheadedness   3. Acute intractable headache, unspecified headache type   4. Chronic daily headache     Orders Placed This Encounter  Procedures  . CT Head Wo Contrast    Standing Status:   Future    Standing Expiration Date:   09/14/2018    Order Specific Question:   Is patient pregnant?    Answer:   No    Order Specific Question:   Preferred imaging location?    Answer:   [facility]    Order Specific Question:   Radiology Contrast Protocol - do NOT remove file path    Answer:   \\[email or web address].pdf  . CT Maxillofacial WO CM    Standing Status:   Future    Standing Expiration Date:   09/14/2018    Order Specific Question:   Is patient pregnant?    Answer:   No    Order Specific Question:   Preferred imaging location?    Answer:   [facility]    Order Specific Question:   Radiology Contrast Protocol - do NOT remove file path    Answer:   \\[email or web address].pdf  . Comprehensive metabolic panel  . Ambulatory referral to Neurology    Referral Priority:   Routine    Referral Type:   Consultation      Referral Reason:   Specialty Services Required    Requested Specialty:   Neurology    Number of Visits Requested:   1  . Orthostatic vital signs  . POCT CBC  . POCT urinalysis dipstick  . POCT SEDIMENTATION RATE    Meds ordered this encounter  Medications  . diphenhydrAMINE (BENADRYL) injection 25 mg  . ketorolac (TORADOL) 30 MG/ML injection 30 mg  . predniSONE (DELTASONE) 20 MG tablet    Sig: Take 3 tabs qd x 2d, then 2 tabs qd x 2d then 1 tab qd x 2d.    Dispense:  12 tablet    Refill:  0    I personally performed the services described in this documentation, which was scribed in my presence. The recorded information has been reviewed and considered, and addended by me as needed.   Dominique Powers,  M.D.  Primary Care at [facility]  [place] [address] [place], [place] [address] [phone] phone [phone] fax  06/17/17 8:27 AM   Results for orders placed or performed in visit on 06/14/17  Comprehensive metabolic panel  Result Value Ref Range   Glucose 117 (H) 65 - 99 mg/dL   BUN 14 6 - 20 mg/dL   Creatinine, Ser 1.300.71 0.57 - 1.00 mg/dL   GFR calc non Af Amer 110 >59 mL/min/1.73   GFR calc Af Amer 127 >59 mL/min/1.73   BUN/Creatinine Ratio 20 9 - 23   Sodium 136 134 - 144 mmol/L   Potassium 4.5 3.5 - 5.2 mmol/L   Chloride 102 96 - 106 mmol/L   CO2 22 20 - 29 mmol/L   Calcium 9.3 8.7 - 10.2 mg/dL   Total Protein 7.2 6.0 - 8.5 g/dL   Albumin 4.4 3.5 - 5.5 g/dL   Globulin, Total 2.8 1.5 - 4.5 g/dL   Albumin/Globulin Ratio 1.6 1.2 - 2.2   Bilirubin Total 0.2 0.0 - 1.2 mg/dL   Alkaline Phosphatase 80 39 - 117 IU/L   AST 20 0 - 40 IU/L   ALT 20 0 - 32 IU/L  POCT CBC  Result Value Ref Range   WBC 5.3 4.6 - 10.2 K/uL   Lymph, poc 2.0 0.6 - 3.4   POC LYMPH PERCENT 37.5 10 - 50 %L   MID (cbc) 0.6 0 - 0.9   POC MID % 10.4 0 - 12 %M   POC Granulocyte 2.8 2 - 6.9   Granulocyte percent 52.1 37 - 80 %G   RBC 4.87 4.04 - 5.48 M/uL   Hemoglobin 14.3 12.2 - 16.2  g/dL   HCT, POC 86.542.8 78.437.7 - 47.9 %   MCV 87.9 80 - 97 fL   MCH, POC 29.4 27 - 31.2 pg   MCHC 33.4 31.8 - 35.4 g/dL   RDW, POC 69.613.3 %   Platelet Count, POC 232 142 - 424 K/uL   MPV 7.6 0 - 99.8 fL  POCT SEDIMENTATION RATE  Result Value Ref Range   POCT SED RATE 20 0 - 22 mm/hr

## 2017-06-14 NOTE — Patient Instructions (Addendum)
If the headache is not relieved or recurs, please call and we will send you for a CT scan of your head and sinuses    IF you received an x-ray today, you will receive an invoice from [facility]. Please contact [facility] at [phone] with questions or concerns regarding your invoice.   IF you received labwork today, you will receive an invoice from [facility]. Please contact [facility] at [phone] with questions or concerns regarding your invoice.   Our billing staff will not be able to assist you with questions regarding bills from these companies.  You will be contacted with the lab results as soon as they are available. The fastest way to get your results is to activate your My Chart account. Instructions are located on the last page of this paperwork. If you have not heard from [place] regarding the results in 2 weeks, please contact this office.      Sinus Headache A sinus headache occurs when the paranasal sinuses become clogged or swollen. Paranasal sinuses are air pockets within the bones of the face. Sinus headaches can range from mild to severe. What are the causes? A sinus headache can result from various conditions that affect the sinuses, such as:  Colds.  Sinus infections.  Allergies.  What are the signs or symptoms? The main symptom of this condition is a headache that may feel like pain or pressure in the face, forehead, ears, or upper teeth. People who have a sinus headache often have other symptoms, such as:  Congested or runny nose.  Fever.  Inability to smell.  Weather changes can make symptoms worse. How is this diagnosed? This condition may be diagnosed based on:  A physical exam and medical history.  Imaging tests, such as a CT scan and MRI, to check for problems with the sinuses.  A specialist may look into the sinuses with a tool that has a camera (endoscopy).  How is this treated? Treatment for this condition depends on the  cause.  Sinus pain that is caused by a sinus infection may be treated with antibiotic medicine.  Sinus pain that is caused by allergies may be helped by allergy medicines (antihistamines) and medicated nasal sprays.  Sinus pain that is caused by congestion may be helped by flushing the nose and sinuses with saline solution.  Follow these instructions at home:  Take medicines only as directed by your health care provider.  If you were prescribed an antibiotic medicine, finish all of it even if you start to feel better.  If you have congestion, use a nasal spray to help reduce pressure.  If directed, apply a warm, moist washcloth to your face to help relieve pain. Contact a health care provider if:  You have headaches more than one time each week.  You have sensitivity to light or sound.  You have a fever.  You feel sick to your stomach (nauseous) or you throw up (vomit).  Your headaches do not get better with treatment. Many people think that they have a sinus headache when they actually have migraines or tension headaches. Get help right away if:  You have vision problems.  You have sudden, severe pain in your face or head.  You have a seizure.  You are confused.  You have a stiff neck. This information is not intended to replace advice given to you by your health care provider. Make sure you discuss any questions you have with your health care provider. Document Released: 11/10/2004 Document  Revised: 05/29/2016 Document Reviewed: 09/29/2014 Elsevier Interactive Patient Education  [employer].

## 2017-06-29 ENCOUNTER — Telehealth: Payer: Self-pay | Admitting: Family Medicine

## 2017-06-29 MED ORDER — AZITHROMYCIN 250 MG PO TABS: ORAL_TABLET | ORAL | 0 refills | Status: DC

## 2017-06-29 MED ORDER — POLYMYXIN B-TRIMETHOPRIM 10000-0.1 UNIT/ML-% OP SOLN: 2.0000 [drp] | OPHTHALMIC | 0 refills | Status: DC

## 2017-06-29 NOTE — Telephone Encounter (Signed)
Patient's 774-year-old daughter stayed home from daycare 1 day last week with an eye infection but her eyes never turn pink. Her doctor called an antibiotic drops and the group cleared up in a few days. However patient woke up with the red eyes with discharge consistent with pinkeye.  Sent in  Meds ordered this encounter  Medications  . trimethoprim-polymyxin b (POLYTRIM) ophthalmic solution    Sig: Place 2 drops into both eyes every 4 (four) hours.    Dispense:  10 mL    Refill:  0

## 2017-07-14 NOTE — Telephone Encounter (Signed)
Pt called asking that results of CPE and TB Questionnaire are re-faxed to employer To: [facility] // FAX: [phone]  Office Visit   06/01/2017 [facility] at [facility], Levell July, MD  Family Medicine   Annual physical exam +4 more  Dx   Annual Exam; Referred by Sherren Mocha, MD  Reason for Visit   Additional Documentation   Vitals:   BP 110/78 (BP Location: Right Arm, Patient Position: Sitting, Cuff Size: Normal)   Pulse 88   Temp 98.1 F (36.7 C) (Oral)   Resp 18   Ht 5' 2.36" (1.584 m)   Wt 148 lb 9.6 oz (67.4 kg)   LMP 05/22/2017 (Approximate)   SpO2 98%   Breastfeeding? Yes   BMI 26.86 kg/m   BSA 1.72 m      More Vitals   Flowsheets:   Custom Formula Data,   MEWS Score,   Anthropometrics,   Lactation,   Infectious Disease Screening,   Fall & Depression Screening,   Vital Signs     Additional Screening:   Hearing and Vision Screening   Encounter Info:   Billing Info,   History,   Allergies,   Detailed Report     All Notes   Progress Notes by Sherren Mocha, MD at 06/01/2017 4:00 PM   Author: Sherren Mocha, MD Author Type: Physician Filed: 06/04/2017 12:23 PM  Note Status: Signed Cosign: Cosign Not Required Encounter Date: 06/01/2017  Editor: Maryfrances Bunnell, CMA (Certified Medical Assistant)  Prior Versions: 1. Sherren Mocha, MD (Physician) at 06/01/2017 5:32 PM - Sign at close encounter    Patient ID: Dominique Powers, female   DOB: Jul 18, 1981, 36 y.o.   MRN: [id number]  Tuberculosis Risk Questionnaire  1. No Were you born outside the [place] in one of the following parts of the world: [place], [place], [place], [place] or [place]?               2. No Have you traveled outside the [place] and lived for more than one month in one of the following parts of the world: [place], [place], [place], [place] or [place]?               3. No Do you have a compromised immune system such as from any of the following  conditions:HIV/AIDS, organ or bone marrow transplantation, diabetes, immunosuppressive medicines (e.g. Prednisone, Remicaide), leukemia, lymphoma, cancer of the head or neck, gastrectomy or jejunal bypass, end-stage renal disease (on dialysis), or silicosis?                           4. No Have you ever or do you plan on working in: a residential care center, a health care facility, a jail or prison or homeless shelter?               5. No Have you ever: injected illegal drugs, used crack cocaine, lived in a homeless shelter  or been in jail or prison?                6. No Have you ever been exposed to anyone with infectious tuberculosis?               Tuberculosis Symptom Questionnaire  Do you currently have any of the following symptoms?  1. No Unexplained cough lasting more than 3 weeks?   2. No Unexplained fever lasting more than  3 weeks.   3. No Night Sweats (sweating that leaves the bedclothes and sheets wet)                4. No Shortness of Breath   5. No Chest Pain   6. No Unintentional weight loss    7. No Unexplained fatigue (very tired for no reason)

## 2017-07-18 ENCOUNTER — Encounter: Payer: Self-pay | Admitting: *Deleted

## 2017-07-18 NOTE — Telephone Encounter (Signed)
Faxed in

## 2017-11-17 ENCOUNTER — Telehealth: Payer: Self-pay | Admitting: Family Medicine

## 2017-11-17 MED ORDER — AZITHROMYCIN 250 MG PO TABS: ORAL_TABLET | ORAL | 0 refills | Status: DC

## 2017-11-18 NOTE — Telephone Encounter (Signed)
Pt called with 7d of a URI that has progressed into a sinus infection. She is having congestion/cough that is now productive of thick yellow-green mucous.  She is absolutely miserable. Requests zpack (PCN allergy w/ angioedema). Pt has to pay out-of-pocket for visits on current poor health ins plan so hoping not to come in.  Last yr, pt tried to tough out an illness and ended up with severe lobular multifocal pna requiring multi antibiotics inc prolonged high dose flouroquinolone treatment and repeated in office IVF for acute renal failure due to dehydration.  Sent in zpack. Let me know if no improvement in 2-3d.

## 2018-01-09 ENCOUNTER — Telehealth: Payer: Self-pay | Admitting: Family Medicine

## 2018-01-09 MED ORDER — PROMETHAZINE-CODEINE 6.25-10 MG/5ML PO SYRP: 10.0000 mL | ORAL_SOLUTION | ORAL | 0 refills | Status: DC | PRN

## 2018-01-09 MED ORDER — BENZONATATE 200 MG PO CAPS: 200.0000 mg | ORAL_CAPSULE | Freq: Three times a day (TID) | ORAL | 0 refills | Status: DC | PRN

## 2018-01-09 MED ORDER — HYDROCOD POLST-CPM POLST ER 10-8 MG/5ML PO SUER: 5.0000 mL | Freq: Two times a day (BID) | ORAL | 0 refills | Status: DC | PRN

## 2018-01-09 MED ORDER — AZITHROMYCIN 250 MG PO TABS: ORAL_TABLET | ORAL | 0 refills | Status: DC

## 2018-01-09 NOTE — Telephone Encounter (Signed)
Pt called on my personal line. She has been sick with cough x 6-7d w/ bad congestions. Cough severe, deep, productive, and now to the point of having post-tussive emesis at night x 2d. Voice was gravely and hoarse but now w/ laryngitis x 2d.  Has been taking mucinex D which helps a little but used a whole bottle of Mucinex cough syrup w/o any relief.  Did start to feel a little better yest but is very nervous since last Aug, pt tried to tough out an illness and ended up with severe lobular multifocal pna requiring multi antibiotics inc prolonged high dose flouroquinolone treatment and repeated in office IVF for acute renal failure due to dehydration.   Pt has to pay out-of-pocket for visits on current poor health ins plan so hoping not to come in.   PCN allergy w/ angioedema. Sent in zpack and anti-tussives. Let me know if no improvement in 2-3d.  Meds ordered this encounter  Medications  . azithromycin (ZITHROMAX) 250 MG tablet    Sig: Take 2 tabs PO x 1 dose, then 1 tab PO QD x 4 days    Dispense:  6 tablet    Refill:  0  . benzonatate (TESSALON) 200 MG capsule    Sig: Take 1 capsule (200 mg total) by mouth 3 (three) times daily as needed for cough.    Dispense:  60 capsule    Refill:  0  . chlorpheniramine-HYDROcodone (TUSSIONEX PENNKINETIC ER) 10-8 MG/5ML SUER    Sig: Take 5 mLs by mouth every 12 (twelve) hours as needed.    Dispense:  120 mL    Refill:  0

## 2018-01-09 NOTE — Telephone Encounter (Signed)
Erroneous duplicate encouter. See prior.

## 2018-02-04 ENCOUNTER — Other Ambulatory Visit: Payer: Self-pay | Admitting: Family Medicine

## 2018-08-11 ENCOUNTER — Other Ambulatory Visit: Payer: Self-pay | Admitting: Family Medicine

## 2018-08-13 NOTE — Telephone Encounter (Signed)
Called patient to schedule an appt for medication refills. No updated dpr on file so did not leave a VM

## 2018-08-13 NOTE — Telephone Encounter (Signed)
Requested medication (s) are due for refill today: yes  Requested medication (s) are on the active medication list: yes  Last refill:  02/05/18 #90  Future visit scheduled: no  Notes to clinic:  Message left for pt to make an appointment and left message for pt to call back   Requested Prescriptions  Pending Prescriptions Disp Refills   sertraline (ZOLOFT) 100 MG tablet [Pharmacy Med Name: SERTRALINE HCL 100 MG TABLET] 90 tablet 1    Sig: TAKE 1 TABLET BY MOUTH EVERY DAY     Psychiatry:  Antidepressants - SSRI Failed - 08/11/2018  9:31 AM      Failed - Valid encounter within last 6 months    Recent Outpatient Visits          1 year ago Acute intractable tension-type headache   [facility] Levell July, MD   1 year ago Annual physical exam   [facility] Levell July, MD   1 year ago Pneumonia of left lower lobe due to infectious organism [facility])   [facility] Levell July, MD   1 year ago Pneumonia of left lower lobe due to infectious organism [facility])   [facility] Levell July, MD   1 year ago Pneumonia of left lower lobe due to infectious organism [facility])   [facility], Levell July, MD

## 2018-08-19 ENCOUNTER — Other Ambulatory Visit: Payer: Self-pay | Admitting: Family Medicine

## 2018-09-09 ENCOUNTER — Telehealth: Payer: Self-pay | Admitting: Family Medicine

## 2018-09-09 MED ORDER — AZITHROMYCIN 250 MG PO TABS: ORAL_TABLET | ORAL | 0 refills | Status: DC

## 2018-09-09 NOTE — Telephone Encounter (Signed)
Meds ordered this encounter  Medications  . azithromycin (ZITHROMAX) 250 MG tablet    Sig: Take 2 tabs PO x 1 dose, then 1 tab PO QD x 4 days    Dispense:  6 tablet    Refill:  0   Pt called - she has had a URI x 11d and has turned into a sinus infxn. Has anaphylactic rxn to amox.  Last rx'd zpack 8 mos ago.  Sent in again to [facility] [employer]

## 2018-11-18 ENCOUNTER — Telehealth: Payer: Self-pay | Admitting: Family Medicine

## 2018-11-18 MED ORDER — AZITHROMYCIN 250 MG PO TABS: ORAL_TABLET | ORAL | 0 refills | Status: DC

## 2018-11-18 NOTE — Telephone Encounter (Signed)
Pt called reporting recurrent sinus infxn. Called in zpack.

## 2018-12-02 ENCOUNTER — Telehealth: Payer: Self-pay | Admitting: Family Medicine

## 2018-12-02 MED ORDER — PREDNISONE 20 MG PO TABS: ORAL_TABLET | ORAL | 0 refills | Status: DC

## 2018-12-02 MED ORDER — DOXYCYCLINE HYCLATE 100 MG PO TABS: 100.0000 mg | ORAL_TABLET | Freq: Two times a day (BID) | ORAL | 0 refills | Status: DC

## 2018-12-02 NOTE — Telephone Encounter (Signed)
Pt called - has been ill w sinus pressure for 3-4 weeks now, minimal to moderate improvement after zpack 2 weeks ago but sinus pressure and congestion never relieved fully and has now been worsening again for the past week.  Left ear pain.  Using mucinex D and benadryl which helps with sxs temporarily but still overall worsening. Bad productive cough with lots of colored mucous.  Sent in doxy 100 bid x 1 wk and 6d 60mg  pred taper to pharm. Pt appreciative.

## 2019-01-20 IMAGING — DX DG CHEST 2V
2 series · 2 of 2 positions shown · non-contrast
Comparison: 02/25/2017

CLINICAL DATA: recheck LLL pneumonia, minimal improvement s/p
azithromycin and Levaquin

EXAM:
CHEST  2 VIEW

[chest pa]
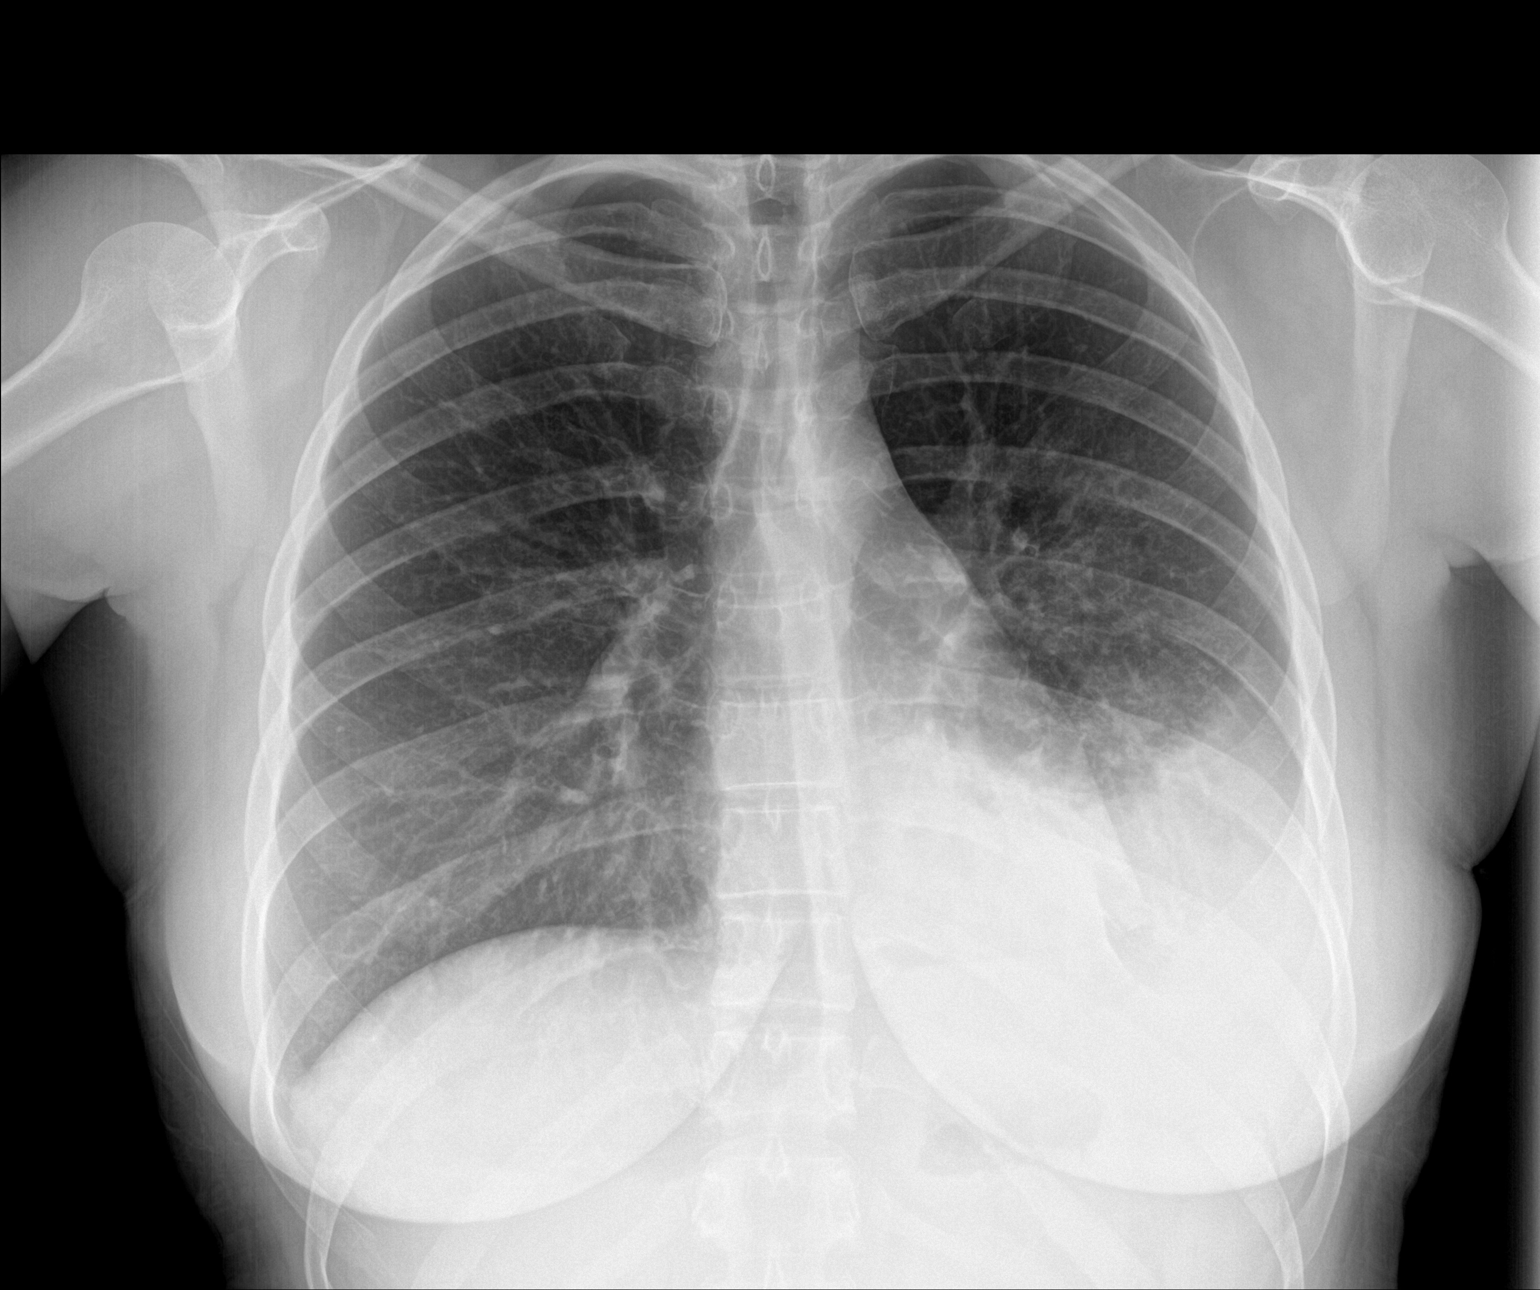

[chest lat]
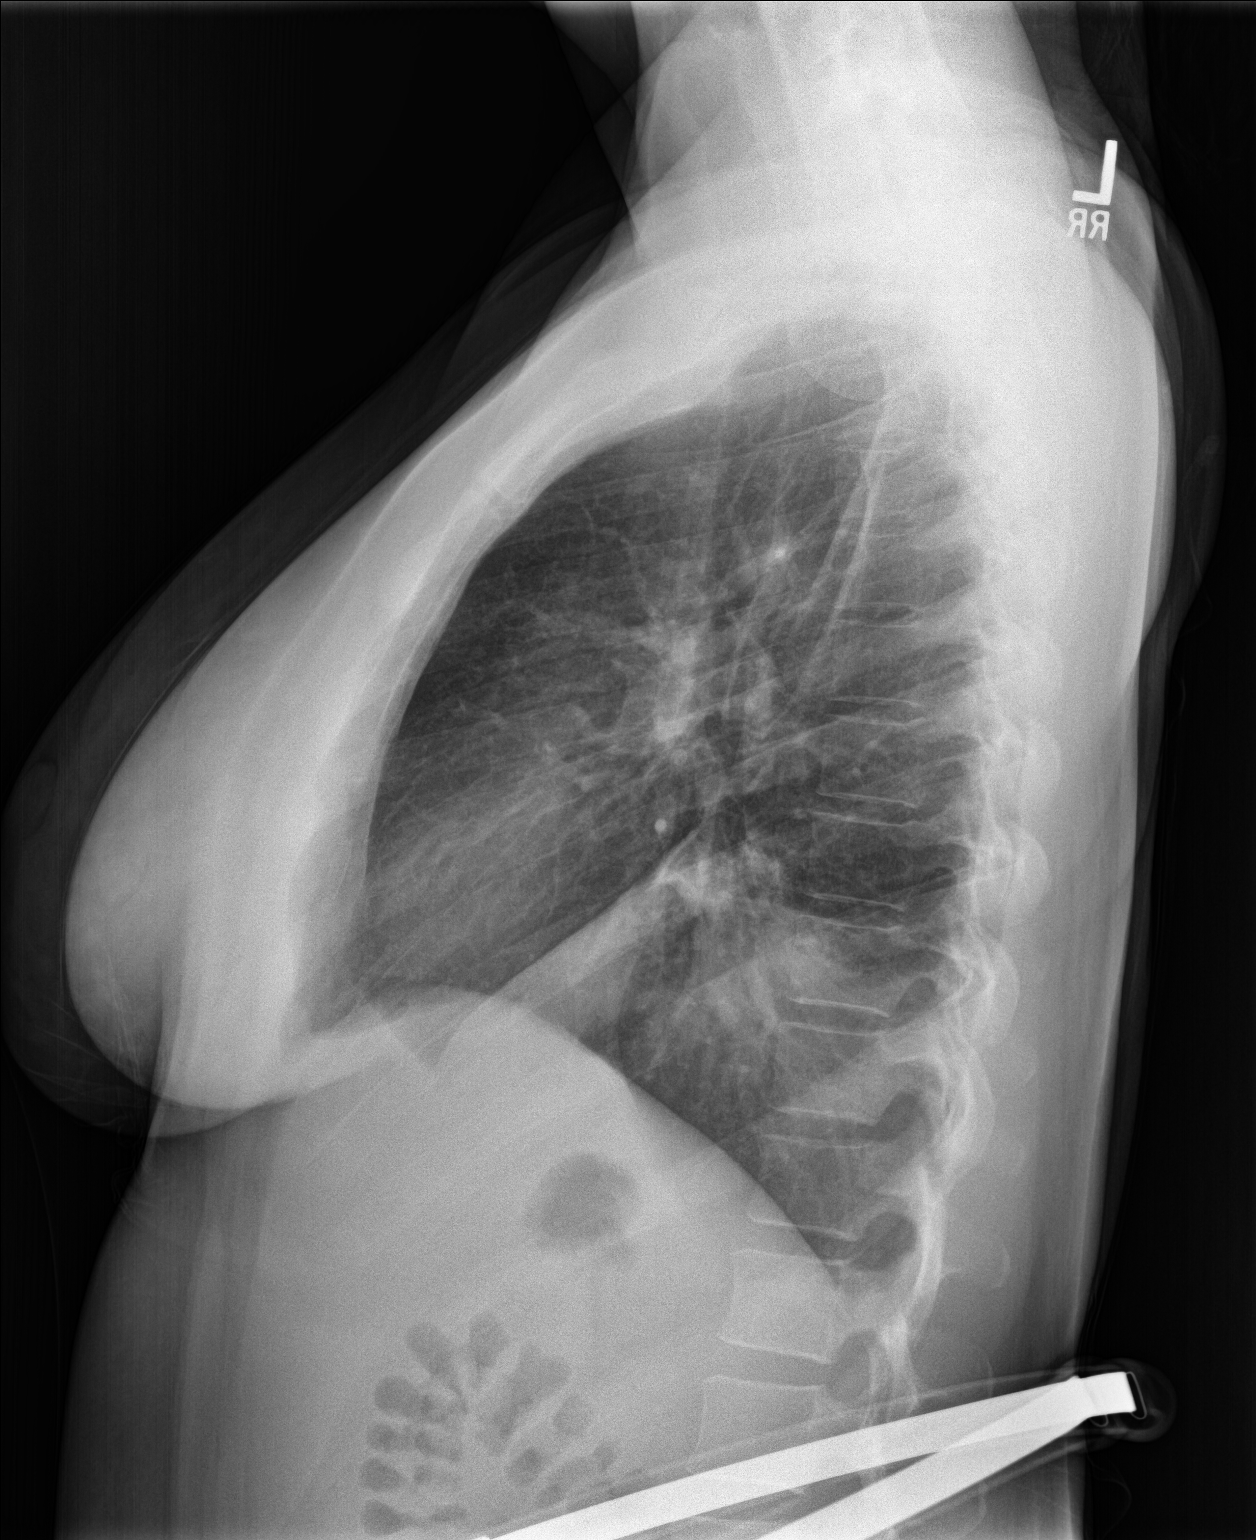

[2 of 2 positions shown; findings below may reference images not displayed]

FINDINGS: There is dense opacity in the left lower lobe which obscures the
hemidiaphragm and is associated with pleural effusion. There has
been some improvement in aeration of the lingula. The right lung
remains clear. No pulmonary edema. Heart size is normal.
IMPRESSION: Persistent significant consolidation of the left lower lobe.
Continued follow-up is recommended.

## 2019-04-04 IMAGING — DX DG CHEST 2V
2 series · 2 of 2 positions shown · non-contrast
Comparison: 04/03/2017 and prior radiographs

CLINICAL DATA: 35-year-old female -followup left pneumonia

EXAM:
CHEST  2 VIEW

[chest pa]
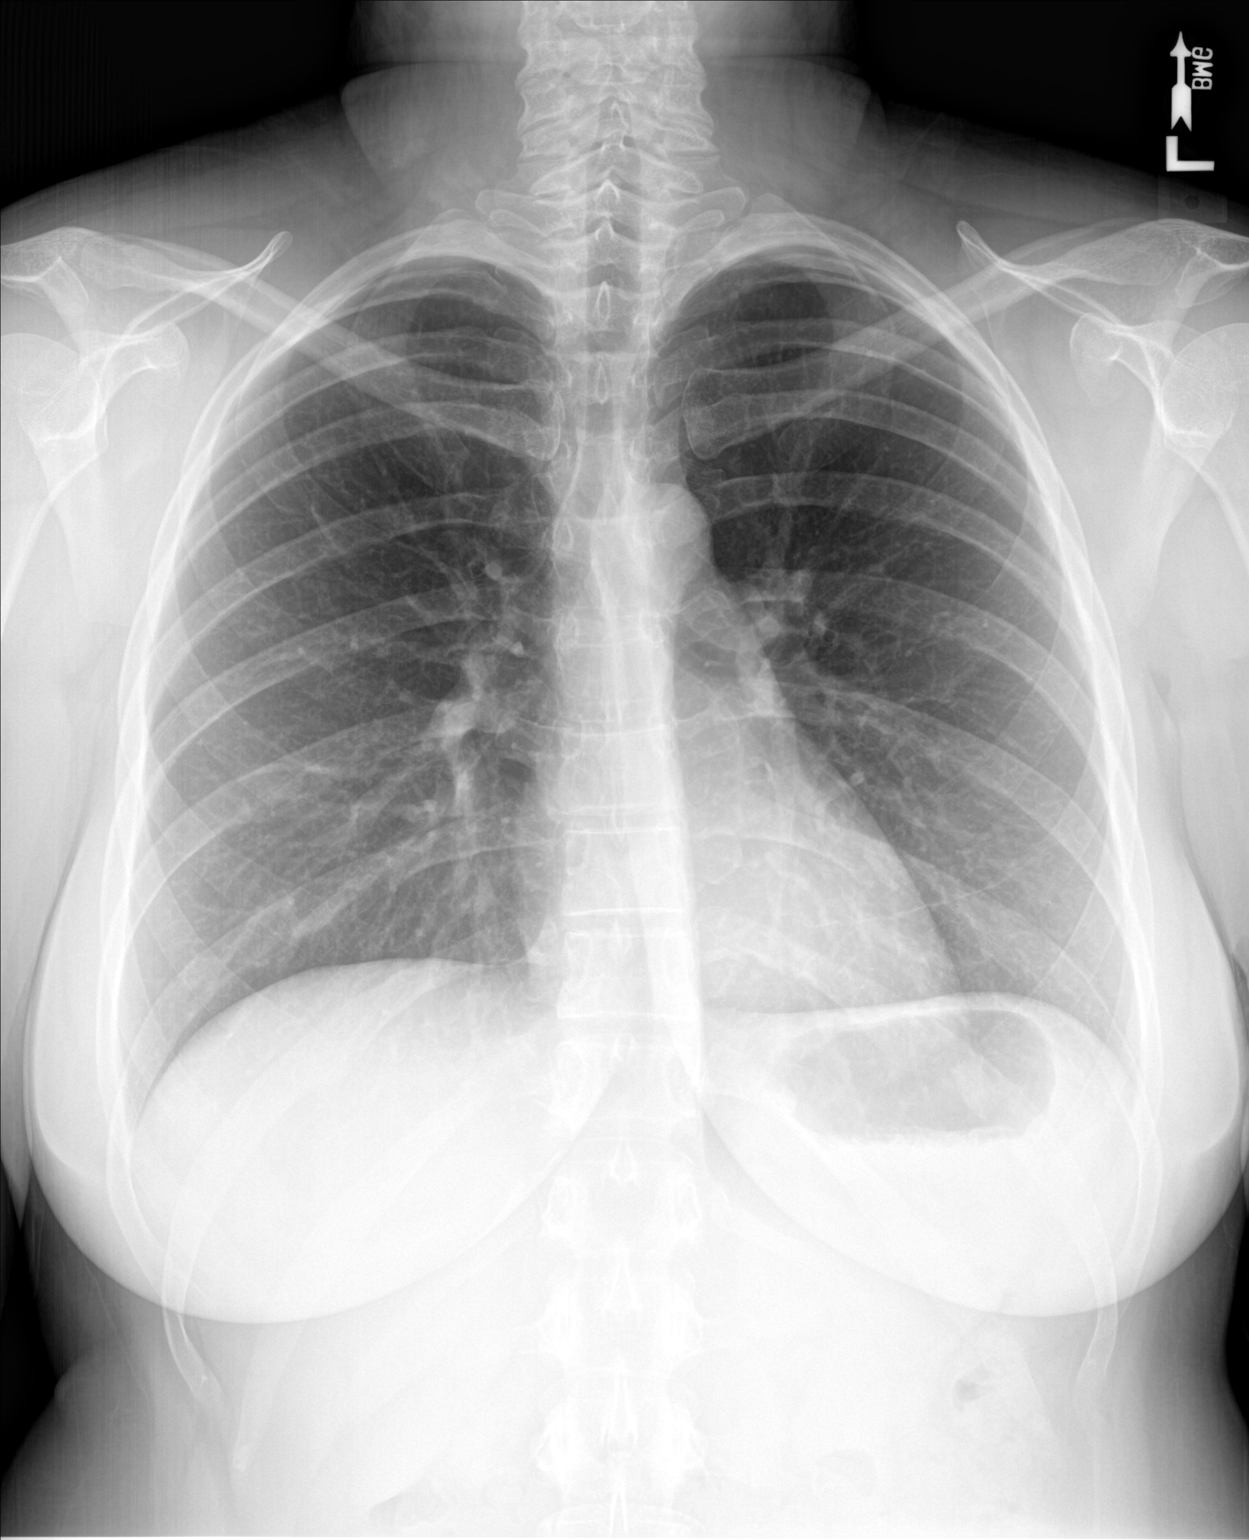

[chest lat]
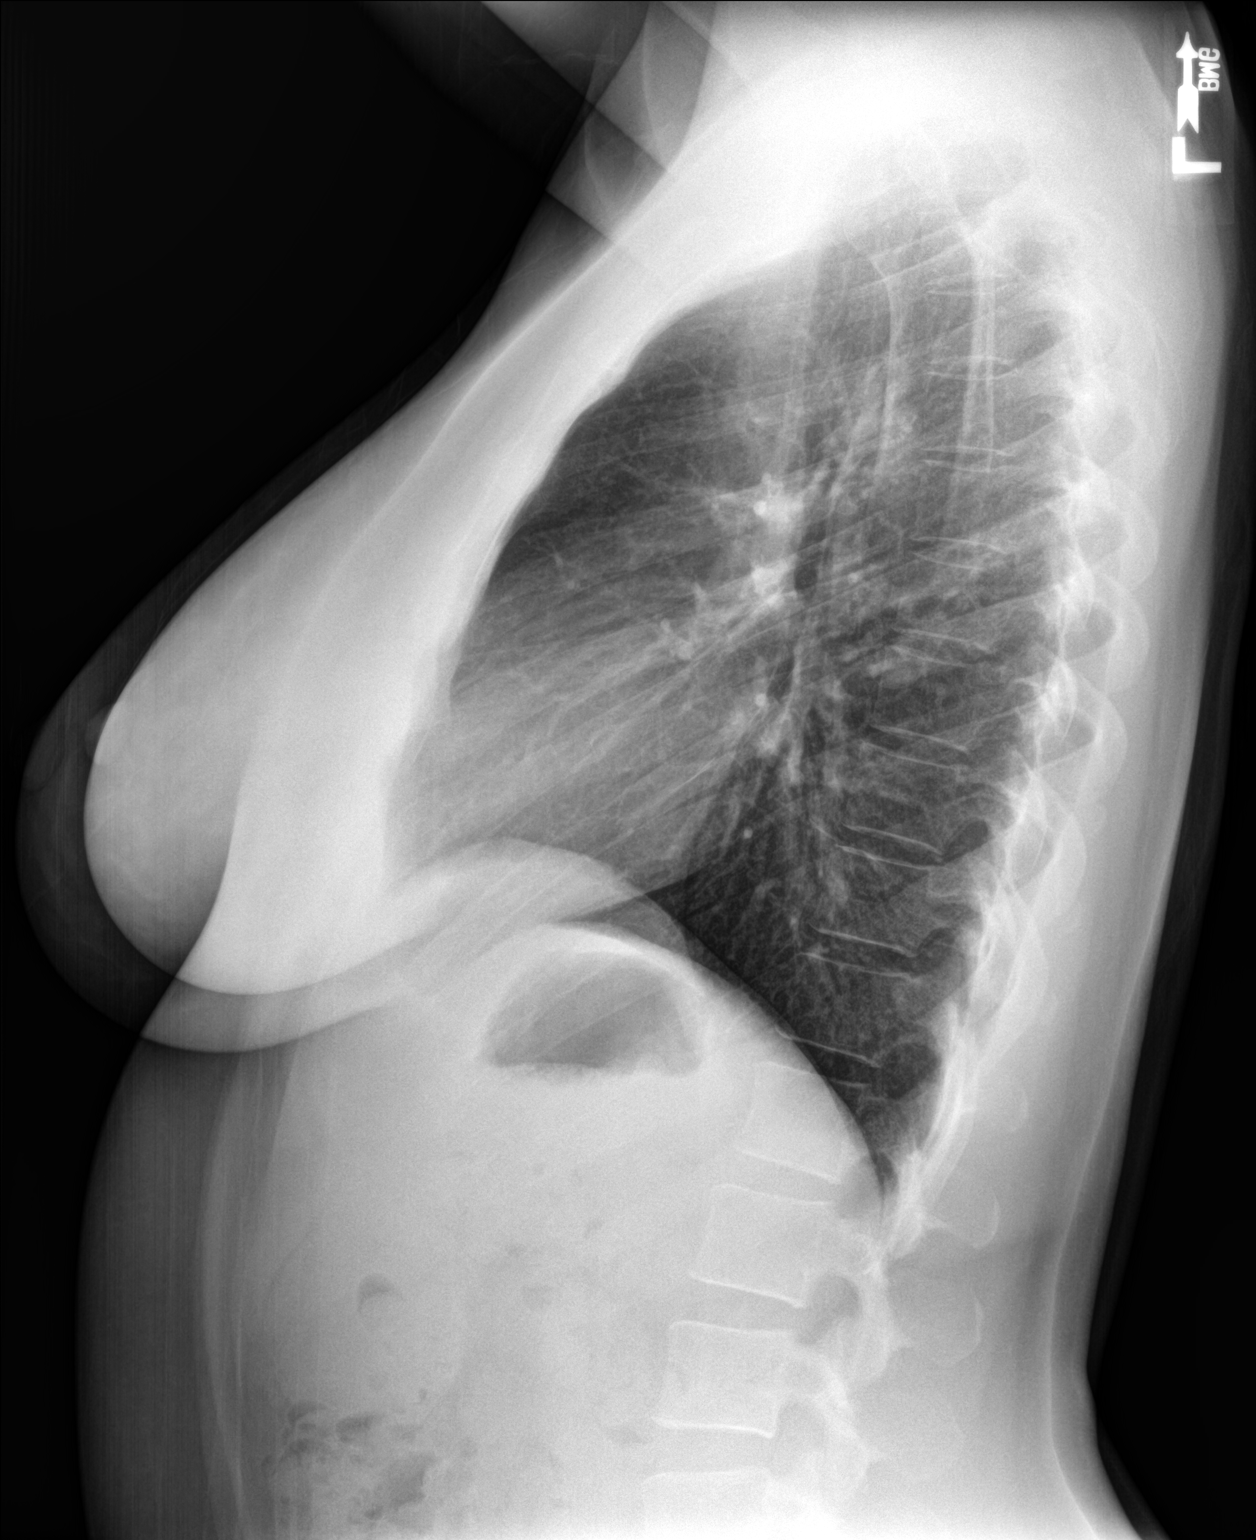

[2 of 2 positions shown; findings below may reference images not displayed]

FINDINGS: The cardiomediastinal silhouette is unremarkable.

There is no evidence of focal airspace disease, pulmonary edema,
suspicious pulmonary nodule/mass, pleural effusion, or pneumothorax.
No acute bony abnormalities are identified.

Left lower lobe opacity has resolved.
IMPRESSION: No evidence of active cardiopulmonary disease.

Resolved left lower lobe opacity.

## 2019-09-03 ENCOUNTER — Other Ambulatory Visit: Payer: Self-pay

## 2019-09-03 DIAGNOSIS — Z20822 Contact with and (suspected) exposure to covid-19: Secondary | ICD-10-CM

## 2019-09-04 LAB — NOVEL CORONAVIRUS, NAA: SARS-CoV-2, NAA: NOT DETECTED

## 2019-10-08 ENCOUNTER — Ambulatory Visit: Payer: BC Managed Care – PPO | Attending: Internal Medicine

## 2019-10-08 DIAGNOSIS — Z20822 Contact with and (suspected) exposure to covid-19: Secondary | ICD-10-CM

## 2019-10-09 LAB — NOVEL CORONAVIRUS, NAA: SARS-CoV-2, NAA: NOT DETECTED

## 2019-10-10 ENCOUNTER — Telehealth: Payer: Self-pay | Admitting: General Practice

## 2019-10-10 NOTE — Telephone Encounter (Signed)
Gave patient covid test results °Patient understood  °

## 2019-11-12 ENCOUNTER — Ambulatory Visit: Payer: BC Managed Care – PPO | Attending: Internal Medicine

## 2019-11-12 DIAGNOSIS — Z20822 Contact with and (suspected) exposure to covid-19: Secondary | ICD-10-CM

## 2019-11-13 LAB — NOVEL CORONAVIRUS, NAA: SARS-CoV-2, NAA: NOT DETECTED

## 2020-01-23 ENCOUNTER — Other Ambulatory Visit: Payer: Self-pay | Admitting: Neurology

## 2020-01-27 ENCOUNTER — Encounter: Payer: Self-pay | Admitting: Neurology

## 2020-01-27 ENCOUNTER — Other Ambulatory Visit: Payer: Self-pay

## 2020-01-27 ENCOUNTER — Ambulatory Visit (INDEPENDENT_AMBULATORY_CARE_PROVIDER_SITE_OTHER): Payer: 59 | Admitting: Neurology

## 2020-01-27 VITALS — BP 122/68 | HR 96 | Temp 98.7°F | Ht 62.0 in | Wt 145.0 lb

## 2020-01-27 DIAGNOSIS — G478 Other sleep disorders: Secondary | ICD-10-CM | POA: Diagnosis not present

## 2020-01-27 DIAGNOSIS — R0683 Snoring: Secondary | ICD-10-CM | POA: Diagnosis not present

## 2020-01-27 DIAGNOSIS — G4719 Other hypersomnia: Secondary | ICD-10-CM | POA: Diagnosis not present

## 2020-01-27 DIAGNOSIS — M542 Cervicalgia: Secondary | ICD-10-CM | POA: Diagnosis not present

## 2020-01-27 DIAGNOSIS — F39 Unspecified mood [affective] disorder: Secondary | ICD-10-CM | POA: Insufficient documentation

## 2020-01-27 DIAGNOSIS — Z1401 Asymptomatic hemophilia A carrier: Secondary | ICD-10-CM

## 2020-01-27 NOTE — Patient Instructions (Signed)

## 2020-01-27 NOTE — Progress Notes (Signed)
SLEEP MEDICINE CLINIC    Provider:  Melvyn Novas, MD  Primary Care Physician:  Sherren Mocha, MD [address] [place] [address]     Referring Provider: Sherren Mocha, Md [address] [place],  [place] [address]          Chief Complaint according to patient   Patient presents with:    . New Patient (Initial Visit)           HISTORY OF PRESENT ILLNESS:  Dominique Powers is seen on 01/27/2020  Chief concern according to patient : Alone. Evaluation for fatigue, chronic daily headaches, increased daytime sleepiness.   I have the pleasure of seeing Dominique Powers, a right-handed, 39 year- old Caucasian female with a possible sleep disorder.  She  has a past medical history of ADD (attention deficit disorder), Anxiety- Depression, GDM, class A1 (04/06/2016)/Gestational diabetes mellitus. 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  The patient reports that she has almost daily headaches, which have improved since she decided to stop coffee, sugar and change her eating habits at [facility].  She is also the mother of a 29-year-old daughter, she is back a lot at her job and at home, she had gestational diabetes, she has been using some CBD oil which seems to help her to fall asleep.  There has been a lot of reduction in her medical list she is taking Vyvanse daily but has not needed triptan, doxycycline, butalbital, Tylenol, Phenergan for nausea, Inderal for migraine prevention.  So her medication list is very short now.   Sleep relevant medical history: neck injury in a gymnastic class-  had neck tension pain since.  Family medical /sleep history: Mother has mild apnea, loud snoring- no other family member on CPAP with OSA, insomnia, and no sleep walkers.    Social history:  Patient is working as a Administrator, arts - 8 th grade, at [employer].  She lives in a household with husband and toddler.Pets are present, 2 dogs.  Tobacco use- none .  ETOH use - none since New years- , Caffeine intake in form of Coffee( none since new years, Soda( none) Tea ( 2-3 cps of black tea) or energy drinks.   Hobbies : gardening, walks daily 2-3 miles.     Sleep habits are as follows: The patient's dinner time is between 5-6 PM. The patient goes to bed at 11-12 PM and continues to sleep for 2 hours, wakes for her bathroom breaks, the first time at 2.30 AM.  She feels dehydrated when she wakes, lightheaded. The preferred sleep position is on her right, with the support of one pillow. Dreams are reportedly infrequenltly. 7.30 AM is the usual rise time. The patient wakes up with an alarm or with her daughters activity. She reports not feeling refreshed or restored in AM, with symptoms such as dry mouth- morning headaches, and residual fatigue. Naps are taken frequently, lasting from 30-40 minutes and are more refreshing than nocturnal sleep. Headaches improve when she naps.    Review of Systems: Out of a complete 14 system review, the patient complains of only the following symptoms, and all other reviewed systems are negative.:  Fatigue, sleepiness , snoring, fragmented sleep, headaches. Not associated with nausea or photophobia, dull headaches.     How likely are you to doze in the following situations: 0 = not likely, 1 = slight chance, 2 = moderate chance, 3 = high  chance   Sitting and Reading? Watching Television? Sitting inactive in a public place (theater or meeting)? As a passenger in a car for an hour without a break? Lying down in the afternoon when circumstances permit? Sitting and talking to someone? Sitting quietly after lunch without alcohol? In a car, while stopped for a few minutes in traffic?   Total = 11/ 24 points   FSS endorsed at 59 / 63 points.   Social History   Socioeconomic History  . Marital status: Married    Spouse name: Not on file  . Number of children: 1  . Years of education: college  .  Highest education level: Master's degree (e.g., MA, MS, MEng, MEd, MSW, MBA)  Occupational History  . Occupation: Runner, broadcasting/film/video  Tobacco Use  . Smoking status: Never Smoker  . Smokeless tobacco: Never Used  Substance and Sexual Activity  . Alcohol use: Not Currently    Alcohol/week: 0.0 standard drinks    Comment: no use since 10/2019  . Drug use: Never  . Sexual activity: Yes  Other Topics Concern  . Not on file  Social History Narrative   Lives at home with husband and daughter.   Right-handed.   2 cups caffeine per day (tea).   Social Determinants of Health   Financial Resource Strain:   . Difficulty of Paying Living Expenses:   Food Insecurity:   . Worried About Programme researcher, broadcasting/film/video in the Last Year:   . Barista in the Last Year:   Transportation Needs:   . Freight forwarder (Medical):   Marland Kitchen Lack of Transportation (Non-Medical):   Physical Activity:   . Days of Exercise per Week:   . Minutes of Exercise per Session:   Stress:   . Feeling of Stress :   Social Connections:   . Frequency of Communication with Friends and Family:   . Frequency of Social Gatherings with Friends and Family:   . Attends Religious Services:   . Active Member of Clubs or Organizations:   . Attends Banker Meetings:   Marland Kitchen Marital Status:     Family History  Problem Relation Age of Onset  . Hyperlipidemia Mother   . 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 Hypertension Mother   . Depression Mother   . Hemophilia Cousin        maternal female cousin  . Healthy Father   . Hemophilia Cousin        maternal female cousin  . Diabetes Maternal Grandmother   . Diabetes Paternal Grandfather     Past Medical History:  Diagnosis Date  . ADD (attention deficit disorder)   . Anxiety   . Depression   . GDM, class A1 04/06/2016  . Gestational diabetes mellitus (GDM), antepartum   . Headache   . 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 Postpartum care following cesarean delivery (6/22)  04/07/2016    Past Surgical History:  Procedure Laterality Date  . CESAREAN SECTION N/A 04/07/2016   Procedure: CESAREAN SECTION;  Surgeon: Shea Evans, MD;  Location: [facility] BIRTHING SUITES;  Service: Obstetrics;  Laterality: N/A;  . MOLE REMOVAL    . WISDOM TOOTH EXTRACTION       Current Outpatient Medications on File Prior to Visit  Medication Sig Dispense Refill  . amphetamine-dextroamphetamine (ADDERALL) 10 MG tablet Take 10 mg by mouth 2 (two) times daily as needed.    . fluticasone (FLONASE) 50 MCG/ACT nasal  spray Place 1 spray into both nostrils as needed for allergies or rhinitis.    Marland Kitchen montelukast (SINGULAIR) 10 MG tablet Take 10 mg by mouth daily.    . NURTEC 75 MG TBDP PLACE 1 TAB UNDER THE TONGUE AT FIRST ONSET OF HEADACHE. DO NOT USE  1 TAB/24 HOURS    . sertraline (ZOLOFT) 100 MG tablet TAKE 1 TABLET BY MOUTH EVERY DAY 90 tablet 3  . VYVANSE 50 MG CHEW TAKE 1/2 TABLET WITH BREAKFAST AND 1/2 TABLET MID MORNING BEFORE LUNCH     No current facility-administered medications on file prior to visit.    Allergies  Allergen Reactions  . Amoxicillin Anaphylaxis    Has patient had a PCN reaction causing immediate rash, facial/tongue/throat swelling, SOB or lightheadedness with hypotension: Yes Has patient had a PCN reaction causing severe rash involving mucus membranes or skin necrosis: No Has patient had a PCN reaction that required hospitalization Yes Has patient had a PCN reaction occurring within the last 10 years: No If all of the above answers are "NO", then may proceed with Cephalosporin use.     Physical exam:  Today's Vitals   01/27/20 0853  BP: 122/68  Pulse: 96  Temp: 98.7 F (37.1 C)  Weight: 145 lb (65.8 kg)  Height: 5\' 2"  (1.575 m)   Body mass index is 26.52 kg/m.   Wt Readings from Last 3 Encounters:  01/27/20 145 lb (65.8 kg)  06/14/17 147 lb (66.7 kg)  06/01/17 148 lb 9.6 oz (67.4 kg)     Ht Readings from Last 3 Encounters:  01/27/20 5\' 2"   (1.575 m)  06/14/17 5\' 2"  (1.575 m)  06/01/17 5' 2.36" (1.584 m)      General: The patient is awake, alert and appears not in acute distress. The patient is well groomed. Head: Normocephalic, atraumatic. Neck is supple. Mallampati 2,  neck circumference: 13.5 inches . Nasal airflow patent.  Retrognathia is not  seen.  Dental status: intact. Cardiovascular:  Regular rate and cardiac rhythm by pulse,  without distended neck veins. Respiratory: Lungs are clear to auscultation.  Skin:  Without evidence of ankle edema, or rash. Trunk: The patient's posture is erect.   Neurologic exam : The patient is awake and alert, oriented to place and time.   Memory subjective described as intact.  Attention span & concentration ability appears normal.  Speech is fluent,  without  dysarthria, dysphonia or aphasia.  Mood and affect are appropriate.   Cranial nerves: no loss of smell or taste reported  Pupils are equal and briskly reactive to light. Funduscopic exam deferred.   Extraocular movements in vertical and horizontal planes were intact and without nystagmus. No Diplopia. Visual fields by finger perimetry are intact. Hearing was intact to soft voice and finger rubbing.  Facial sensation intact to fine touch. Facial motor strength is symmetric and tongue and uvula move midline.  Neck ROM : rotation, tilt and flexion extension were normal for age and shoulder shrug was symmetrical.    Motor exam:  Symmetric bulk, tone and ROM.   Normal tone without cog wheeling, symmetric grip strength . Sensory:  Fine touch, pinprick and vibration were tested  and  normal.  Proprioception tested in the upper extremities was normal. Coordination: Rapid alternating movements in the fingers/hands were of normal speed.  The Finger-to-nose maneuver was intact without evidence of ataxia, dysmetria or tremor. Gait and station: Patient could rise unassisted from a seated position, walked without assistive device.  Toe  and heel walk were deferred.  Deep tendon reflexes: in the  upper and lower extremities are symmetric and intact.  Babinski response was deferred.      After spending a total time of  40  minutes face to face and additional time for physical and neurologic examination, review of laboratory studies,  personal review of imaging studies, reports and results of other testing and review of referral information / records as far as provided in visit, I have established the following assessments:  1)  Non refreshing sleep and morning headaches  2)  Neck pain and headaches, dull, not throbbing. 3)  EDS- excessive fatigue and sleepiness.    My Plan is to proceed with:  1) Sleep study to rule out apnea, for the reported snoring.  2) if no OSA is found, will need an attended sleep study next.  3)  Keep up with hydration.   I would like to thank Shawnee Knapp, MD and Shawnee Knapp, Md [address] [place],  [place] [address] for allowing me to meet with and to take care of this pleasant patient.  I plan to follow up either personally or through our NP within 2-3 month.     Electronically signed by: Larey Seat, MD 01/27/2020 9:18 AM  [facility] Board certified by The [employer] of Sleep Medicine and Diplomate of the [employer] of Sleep Medicine. Board certified In Neurology through the [place], Fellow of the [employer] of Neurology. Medical Director of [facility].

## 2020-02-24 ENCOUNTER — Ambulatory Visit (INDEPENDENT_AMBULATORY_CARE_PROVIDER_SITE_OTHER): Payer: 59 | Admitting: Neurology

## 2020-02-24 DIAGNOSIS — G4733 Obstructive sleep apnea (adult) (pediatric): Secondary | ICD-10-CM | POA: Diagnosis not present

## 2020-02-24 DIAGNOSIS — M542 Cervicalgia: Secondary | ICD-10-CM

## 2020-02-24 DIAGNOSIS — R0683 Snoring: Secondary | ICD-10-CM

## 2020-02-24 DIAGNOSIS — G478 Other sleep disorders: Secondary | ICD-10-CM

## 2020-03-02 NOTE — Progress Notes (Signed)
Sleep relevant medical history: suffered a neck injury in a  gymnastic class- had neck tension pain since.    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   Recommendations:    1)Avoid supine sleep. 2) This low degree of apnea and lack of  hypoxemia does not explain morning headaches or excessive  sleepiness. Snoring is mild- moderate and can be treated by positional changes as well.  Follow up with NP is optional.

## 2020-03-02 NOTE — Procedures (Signed)
Patient Information     First Name: Dominique Last Name: Powers ID: [id number]  Birth Date: [birth year] Age: 39 Gender: Female  Referring Provider: Sherren Mocha, MD BMI: 26.8 (W=145 lbs, H=5' 2'')  Neck Circ.:  13 '' Epworth:  11/24   Sleep Study Information    Study Date: Feb 24, 2020 S/H/A Version: [id number] / [id number] / 71  History:    Dominique Powers is a right-handed 39 year- old Caucasian female with a medical history of ADD (attention deficit disorder), Anxiety- Depression, GDM, class A1 (04/06/2016)/Gestational diabetes mellitus. 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  The patient reports that she has almost daily headaches, which have improved since she quit drinking coffee, sugar and change her eating habits at [facility].  She is also the mother of a 77-year-old daughter, she has been using some CBD oil which seems to help her to fall asleep.  There has been a lot of reduction in her medical list she is taking Vyvanse daily but has not needed triptan, doxycycline, butalbital, Tylenol, Phenergan for nausea, Inderal for migraine prevention -her medication list is very short now.   Sleep relevant medical history: suffered a neck injury in a gymnastic class- had neck tension pain since.    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   Recommendations:     1)Avoid supine sleep. 2) This low degree of apnea and lack of hypoxemia does not explain morning headaches or excessive sleepiness. Follow up with NP is optional.   Interpreting Physician: Melvyn Novas, MD             Sleep Summary  Oxygen Saturation Statistics   Start Study Time: End Study Time: Total Recording Time:       10:32:02 PM 7:03:38 AM 8 h, 31 min   Total Sleep Time % REM of Sleep Time:  7 h, 36 min 16.4    Mean: 95 Minimum: 90 Maximum: 98  Mean of Desaturations Nadirs (%):   93  Oxygen Desaturation. %: 4-9 10-20 >20 Total  Events Number Total  10 100.0  0 0.0  0 0.0  10 100.0  Oxygen Saturation: <90 <=88 <85 <80 <70  Duration (minutes): Sleep % 0.0 0.0 0.0 0.0 0.0 0.0 0.0 0.0 0.0 0.0     Respiratory Indices      Total Events REM NREM All Night  pRDI:  38  pAHI:  25 ODI:  10  pAHIc:  4  % CSR: 0.0 12.8 6.4 1.6 4.4 3.5 2.7 1.3 0.2 5.0 3.3 1.3 0.6       Pulse Rate Statistics during Sleep (BPM)      Mean: 78 Minimum: 62 Maximum: 108    Indices are calculated using technically valid sleep time of 7 h, 35 min. Central-Indices are calculated using technically valid sleep time of 6 h, 36 min. pRDI/pAHI are calculated using 02 desaturations ? 3%  Body Position Statistics  Position Supine Prone Right Left Non-Supine  Sleep (min) 136.5 256.1 40.5 23.5 320.1  Sleep % 29.9 56.1 8.9 5.1 70.1  pRDI 6.2 3.8 8.9 5.2 4.5  pAHI 2.6 2.6 8.9 5.2 3.6  ODI 1.8 0.5 4.5 2.6 1.1     Snoring Statistics Snoring  Level (dB) >40 >50 >60 >70 >80 >Threshold (45)  Sleep (min) 144.2 2.9 1.2 0.0 0.0 5.5  Sleep % 31.6 0.6 0.3 0.0 0.0 1.2    Mean: 41 dB Sleep Stages Chart

## 2020-03-03 ENCOUNTER — Encounter: Payer: Self-pay | Admitting: Neurology

## 2020-03-03 ENCOUNTER — Telehealth: Payer: Self-pay | Admitting: Neurology

## 2020-03-03 NOTE — Telephone Encounter (Signed)
Called patient to discuss sleep study results. No answer at this time. LVM for the patient to call back.  Will send a mychart message also.  

## 2020-03-03 NOTE — Telephone Encounter (Signed)
-----   Message from Melvyn Novas, MD sent at 03/02/2020  5:41 PM EDT ----- Sleep relevant medical history: suffered a neck injury in a  gymnastic class- had neck tension pain since.    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   Recommendations:    1)Avoid supine sleep. 2) This low degree of apnea and lack of  hypoxemia does not explain morning headaches or excessive  sleepiness. Snoring is mild- moderate and can be treated by positional changes as well.  Follow up with NP is optional.

## 2022-12-06 ENCOUNTER — Ambulatory Visit: Payer: BC Managed Care – PPO | Admitting: Family Medicine

## 2022-12-29 ENCOUNTER — Ambulatory Visit: Payer: Managed Care, Other (non HMO) | Admitting: Family Medicine

## 2022-12-29 ENCOUNTER — Encounter: Payer: Self-pay | Admitting: Family Medicine

## 2022-12-29 VITALS — BP 134/84 | HR 103 | Temp 97.8°F | Ht 62.0 in | Wt 180.0 lb

## 2022-12-29 DIAGNOSIS — Z975 Presence of (intrauterine) contraceptive device: Secondary | ICD-10-CM

## 2022-12-29 DIAGNOSIS — J3089 Other allergic rhinitis: Secondary | ICD-10-CM | POA: Diagnosis not present

## 2022-12-29 DIAGNOSIS — L659 Nonscarring hair loss, unspecified: Secondary | ICD-10-CM

## 2022-12-29 DIAGNOSIS — R6889 Other general symptoms and signs: Secondary | ICD-10-CM | POA: Diagnosis not present

## 2022-12-29 DIAGNOSIS — R635 Abnormal weight gain: Secondary | ICD-10-CM

## 2022-12-29 DIAGNOSIS — R5383 Other fatigue: Secondary | ICD-10-CM | POA: Diagnosis not present

## 2022-12-29 DIAGNOSIS — F32A Depression, unspecified: Secondary | ICD-10-CM

## 2022-12-29 DIAGNOSIS — Z7689 Persons encountering health services in other specified circumstances: Secondary | ICD-10-CM | POA: Diagnosis not present

## 2022-12-29 LAB — COMPREHENSIVE METABOLIC PANEL
ALT: 20 U/L (ref 0–35)
AST: 18 U/L (ref 0–37)
Albumin: 4.3 g/dL (ref 3.5–5.2)
Alkaline Phosphatase: 68 U/L (ref 39–117)
BUN: 15 mg/dL (ref 6–23)
CO2: 27 mEq/L (ref 19–32)
Calcium: 9.9 mg/dL (ref 8.4–10.5)
Chloride: 102 mEq/L (ref 96–112)
Creatinine, Ser: 0.84 mg/dL (ref 0.40–1.20)
GFR: 85.66 mL/min (ref >60.00)
Glucose, Bld: 128 mg/dL — ABNORMAL HIGH (ref 70–99)
Potassium: 4.2 mEq/L (ref 3.5–5.1)
Sodium: 140 mEq/L (ref 135–145)
Total Bilirubin: 0.2 mg/dL (ref 0.2–1.2)
Total Protein: 7.4 g/dL (ref 6.0–8.3)

## 2022-12-29 LAB — CBC WITH DIFFERENTIAL/PLATELET
Basophils Absolute: 0.1 10*3/uL (ref 0.0–0.1)
Basophils Relative: 0.8 % (ref 0.0–3.0)
Eosinophils Absolute: 0.3 10*3/uL (ref 0.0–0.7)
Eosinophils Relative: 3.2 % (ref 0.0–5.0)
HCT: 42.2 % (ref 36.0–46.0)
Hemoglobin: 14.5 g/dL (ref 12.0–15.0)
Lymphocytes Relative: 32.2 % (ref 12.0–46.0)
Lymphs Abs: 2.6 10*3/uL (ref 0.7–4.0)
MCHC: 34.3 g/dL (ref 30.0–36.0)
MCV: 88.2 fl (ref 78.0–100.0)
Monocytes Absolute: 0.5 10*3/uL (ref 0.1–1.0)
Monocytes Relative: 6.7 % (ref 3.0–12.0)
Neutro Abs: 4.5 10*3/uL (ref 1.4–7.7)
Neutrophils Relative %: 57.1 % (ref 43.0–77.0)
Platelets: 288 10*3/uL (ref 150.0–400.0)
RBC: 4.78 Mil/uL (ref 3.87–5.11)
RDW: 13.3 % (ref 11.5–15.5)
WBC: 8 10*3/uL (ref 4.0–10.5)

## 2022-12-29 LAB — T4, FREE: Free T4: 0.7 ng/dL (ref 0.60–1.60)

## 2022-12-29 LAB — T3, FREE: T3, Free: 3 pg/mL (ref 2.3–4.2)

## 2022-12-29 LAB — VITAMIN D 25 HYDROXY (VIT D DEFICIENCY, FRACTURES): VITD: 12.95 ng/mL — ABNORMAL LOW (ref 30.00–100.00)

## 2022-12-29 LAB — TSH: TSH: 1.88 u[IU]/mL (ref 0.35–5.50)

## 2022-12-29 LAB — VITAMIN B12: Vitamin B-12: 432 pg/mL (ref 211–911)

## 2022-12-29 NOTE — Patient Instructions (Signed)
Thank you for trusting [place] with your healthcare.   Please go downstairs for labs and we will be in touch with your results.

## 2022-12-29 NOTE — Progress Notes (Signed)
New Patient Office Visit  Subjective    Patient ID: Dominique Powers, female    DOB: 1980/11/18  Age: 42 y.o. MRN: [id number]  CC:  Chief Complaint  Patient presents with   Establish Care    Concerned about possible thyroid problem, gained 50 lbs, thinning hair, fatigued and heat intolerance.     HPI Dominique Powers presents to establish care Previous care: Dr. Brigitte Pulse  Other providers: Needs new OB/GYN   C/o fatigue x 6 months, hair thinning, heat intolerance and weight gain.   Hx of allergies  ADHD- has been on Vyvanse 60 mg Medications lasts 4-5 hours.   Depression- takes sertraline 100 mg daily.  6 years on this  Sleep test- very mild and does not need CPAP     Outpatient Encounter Medications as of 12/29/2022  Medication Sig   Cetirizine HCl (ZYRTEC ALLERGY) 10 MG TBDP    Lisdexamfetamine Dimesylate (VYVANSE) 60 MG CHEW Chew by mouth.   sertraline (ZOLOFT) 100 MG tablet TAKE 1 TABLET BY MOUTH EVERY DAY   [DISCONTINUED] amphetamine-dextroamphetamine (ADDERALL) 10 MG tablet Take 10 mg by mouth 2 (two) times daily as needed.   [DISCONTINUED] fluticasone (FLONASE) 50 MCG/ACT nasal spray Place 1 spray into both nostrils as needed for allergies or rhinitis.   [DISCONTINUED] montelukast (SINGULAIR) 10 MG tablet Take 10 mg by mouth daily.   [DISCONTINUED] NURTEC 75 MG TBDP PLACE 1 TAB UNDER THE TONGUE AT FIRST ONSET OF HEADACHE. DO NOT USE  1 TAB/24 HOURS   [DISCONTINUED] VYVANSE 50 MG CHEW TAKE 1/2 TABLET WITH BREAKFAST AND 1/2 TABLET MID MORNING BEFORE LUNCH (Patient not taking: Reported on 12/29/2022)   No facility-administered encounter medications on file as of 12/29/2022.    Past Medical History:  Diagnosis Date   ADD (attention deficit disorder)    Alcohol use complicating pregnancy in first trimester 10/28/2015   Formatting of this note might be different from the original. 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    Hypertension    Postpartum care following cesarean delivery (6/22) 04/07/2016    Past Surgical History:  Procedure Laterality Date   CESAREAN SECTION N/A 04/07/2016   Procedure: CESAREAN SECTION;  Surgeon: Azucena Fallen, MD;  Location: [facility];  Service: Obstetrics;  Laterality: N/A;   MOLE REMOVAL     WISDOM TOOTH EXTRACTION      Family History  Problem Relation Age of Onset   Hyperlipidemia Mother    Other Mother        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   Hypertension Mother    Depression Mother    ADD / ADHD Mother    Alcohol abuse Mother    Hemophilia Cousin        maternal female cousin   Healthy Father    ADD / ADHD Sister    Learning disabilities Sister    Hemophilia Cousin        maternal female cousin   Diabetes Maternal Grandmother    Diabetes Paternal Grandfather    Stroke Sister     Social History   Socioeconomic History   Marital status: Married    Spouse name: Not on file   Number of children: 1   Years of education: college   Highest education level: Master's degree (e.g., MA, MS,  MEng, MEd, MSW, MBA)  Occupational History   Occupation: Teacher  Tobacco Use   Smoking status: Never   Smokeless tobacco: Never  Substance and Sexual Activity   Alcohol use: Yes    Alcohol/week: 5.0 standard drinks of alcohol    Types: 5 Glasses of wine per week    Comment: no use since 10/2019   Drug use: Never   Sexual activity: Yes    Birth control/protection: I.U.D.  Other Topics Concern   Not on file  Social History Narrative   Lives at home with husband and daughter.   Right-handed.   2 cups caffeine per day (tea).   Social Determinants of Health   Financial Resource Strain: Not on file  Food Insecurity: Not on file  Transportation Needs: Not on file  Physical Activity: Not on file  Stress: Not on file  Social Connections: Not on file   Intimate Partner Violence: Not on file    Review of Systems  Constitutional:  Positive for malaise/fatigue. Negative for chills and fever.  Respiratory: Negative.  Negative for shortness of breath.   Cardiovascular:  Negative for chest pain, palpitations and leg swelling.  Gastrointestinal:  Negative for abdominal pain, constipation, diarrhea, nausea and vomiting.  Genitourinary:  Negative for dysuria, frequency and urgency.  Neurological:  Negative for dizziness and tingling.  Psychiatric/Behavioral:  Positive for depression.         Objective    BP 134/84 (BP Location: Left Arm, Patient Position: Sitting, Cuff Size: Large)   Pulse (!) 103   Temp 97.8 F (36.6 C) (Temporal)   Ht 5\' 2"  (1.575 m)   Wt 180 lb (81.6 kg)   SpO2 98%   BMI 32.92 kg/m   Physical Exam Constitutional:      General: She is not in acute distress.    Appearance: She is not ill-appearing.  Eyes:     Extraocular Movements: Extraocular movements intact.     Conjunctiva/sclera: Conjunctivae normal.  Cardiovascular:     Rate and Rhythm: Normal rate and regular rhythm.  Pulmonary:     Effort: Pulmonary effort is normal.     Breath sounds: Normal breath sounds.  Musculoskeletal:     Cervical back: Normal range of motion.  Skin:    General: Skin is warm and dry.  Neurological:     General: No focal deficit present.     Mental Status: She is alert and oriented to person, place, and time.  Psychiatric:        Mood and Affect: Mood normal.        Behavior: Behavior normal.        Thought Content: Thought content normal.         Assessment & Plan:   Problem List Items Addressed This Visit   None Visit Diagnoses     Fatigue, unspecified type    -  Primary   Relevant Orders   CBC with Differential/Platelet (Completed)   Comprehensive metabolic panel (Completed)   TSH (Completed)   T3, free (Completed)   T4, free (Completed)   Vitamin B12 (Completed)   VITAMIN D 25 Hydroxy (Vit-D  Deficiency, Fractures) (Completed)   ANA (Completed)   Iron, TIBC and Ferritin Panel (Completed)   Encounter to establish care       IUD (intrauterine device) in place       Relevant Orders   Ambulatory referral to Gynecology   Environmental and seasonal allergies       Depression, unspecified depression type  Thinning hair       Relevant Orders   CBC with Differential/Platelet (Completed)   TSH (Completed)   T3, free (Completed)   T4, free (Completed)   ANA (Completed)   Heat intolerance       Relevant Orders   CBC with Differential/Platelet (Completed)   TSH (Completed)   T3, free (Completed)   T4, free (Completed)   ANA (Completed)   Weight gain       Relevant Orders   CBC with Differential/Platelet (Completed)   Comprehensive metabolic panel (Completed)   TSH (Completed)   T3, free (Completed)   T4, free (Completed)   ANA (Completed)      She is new and establishing care today. Multiple ongoing concerns.  We will need to check labs to look for underlying etiology for fatigue, weight gain, heat intolerance and hair thinning.  Vyvanse is working for her. Sertraline for depression. Follow up pending labs.   Return for pending labs.   Harland Dingwall, NP-C

## 2022-12-30 ENCOUNTER — Other Ambulatory Visit: Payer: Self-pay | Admitting: Family Medicine

## 2022-12-30 DIAGNOSIS — E559 Vitamin D deficiency, unspecified: Secondary | ICD-10-CM

## 2022-12-30 MED ORDER — VITAMIN D (ERGOCALCIFEROL) 1.25 MG (50000 UNIT) PO CAPS: 50000.0000 [IU] | ORAL_CAPSULE | ORAL | 2 refills | Status: DC

## 2023-01-01 ENCOUNTER — Other Ambulatory Visit: Payer: Self-pay | Admitting: Family Medicine

## 2023-01-01 DIAGNOSIS — R768 Other specified abnormal immunological findings in serum: Secondary | ICD-10-CM

## 2023-01-01 LAB — ANTI-NUCLEAR AB-TITER (ANA TITER)
ANA TITER: 1:320 {titer} — ABNORMAL HIGH
ANA Titer 1: 1:80 {titer} — ABNORMAL HIGH

## 2023-01-01 LAB — ANA: Anti Nuclear Antibody (ANA): POSITIVE — AB

## 2023-01-01 LAB — IRON,TIBC AND FERRITIN PANEL
%SAT: 15 % (calc) — ABNORMAL LOW (ref 16–45)
Ferritin: 17 ng/mL (ref 16–232)
Iron: 56 ug/dL (ref 40–190)
TIBC: 365 mcg/dL (calc) (ref 250–450)

## 2023-01-01 NOTE — Progress Notes (Signed)
Please reach out to her with the following:  Your ANA test is positive. This is indicative of an autoimmune condition. I will refer you to a rheumatologist for further work up to determine which autoimmune condition you may have and how to proceed with treatment. They will call you in the next week or two. Let us know if you have not heard in 2 weeks please.

## 2023-01-03 ENCOUNTER — Encounter: Payer: Self-pay | Admitting: Family Medicine

## 2023-01-03 ENCOUNTER — Ambulatory Visit: Payer: Managed Care, Other (non HMO) | Admitting: Family Medicine

## 2023-01-03 VITALS — BP 134/90 | HR 103 | Temp 97.6°F | Ht 62.0 in | Wt 180.0 lb

## 2023-01-03 DIAGNOSIS — E559 Vitamin D deficiency, unspecified: Secondary | ICD-10-CM

## 2023-01-03 DIAGNOSIS — R7689 Other specified abnormal immunological findings in serum: Secondary | ICD-10-CM

## 2023-01-03 DIAGNOSIS — R5383 Other fatigue: Secondary | ICD-10-CM

## 2023-01-03 DIAGNOSIS — R4189 Other symptoms and signs involving cognitive functions and awareness: Secondary | ICD-10-CM

## 2023-01-03 DIAGNOSIS — R768 Other specified abnormal immunological findings in serum: Secondary | ICD-10-CM | POA: Diagnosis not present

## 2023-01-03 NOTE — Progress Notes (Signed)
Subjective:     Patient ID: Dominique Powers, female    DOB: 1981-08-05, 42 y.o.   MRN: [id number]  Chief Complaint  Patient presents with   Medication Management    Discuss switching from Vyvanse to Adderall as well as altitude sickness medication    Labs Only    Pt has questions regarding lab results, not able to schedule with rheumatology till mid August    HPI Patient is in today for multiple issues.   Recent ANA positive testing. States her biggest issue is fatigue. She is awaiting rheumatology appointment.   Vitamin D deficiency- taking prescription dose now.   States she thinks she wants to go back to Adderall instead of Vyvanse.  She is taking  States she tried Mydayis in the past.   States none of the medications seem to last long enough. States when they wear off, she needs a nap and has brain fog. States she is "crashing" on the medication.   States she was diagnosed with ADD at age 42 in [place]. She has the documentation at home.  It has been recommended that she get re tested for ADD in the past year or two.   States she is traveling to [place] soon and is concerned she may have altitude sickness. Denies ever having altitude sickness in the past and she has traveled to areas of high elevations.      Health Maintenance Due  Topic Date Due   COVID-19 Vaccine (1) Never done   Hepatitis C Screening  Never done   PAP SMEAR-Modifier  10/20/2018    Past Medical History:  Diagnosis Date   ADD (attention deficit disorder)    Alcohol use complicating pregnancy in first trimester 10/28/2015   Formatting of this note might be different from the original. 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    Hypertension    Postpartum care following cesarean delivery (6/22) 04/07/2016    Past Surgical History:   Procedure Laterality Date   CESAREAN SECTION N/A 04/07/2016   Procedure: CESAREAN SECTION;  Surgeon: Azucena Fallen, MD;  Location: [facility];  Service: Obstetrics;  Laterality: N/A;   MOLE REMOVAL     WISDOM TOOTH EXTRACTION      Family History  Problem Relation Age of Onset   Hyperlipidemia Mother    Other Mother        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   Hypertension Mother    Depression Mother    ADD / ADHD Mother    Alcohol abuse Mother    Hemophilia Cousin        maternal female cousin   Healthy Father    ADD / ADHD Sister    Learning disabilities Sister    Hemophilia Cousin        maternal female cousin   Diabetes Maternal Grandmother    Diabetes Paternal Grandfather    Stroke Sister     Social History   Socioeconomic History   Marital status: Married    Spouse name: Not on file   Number of children: 1   Years of education: college   Highest education level: Master's degree (e.g., MA, MS, MEng, MEd, MSW, MBA)  Occupational History   Occupation: Teacher  Tobacco Use   Smoking status: Never   Smokeless tobacco:  Never  Substance and Sexual Activity   Alcohol use: Yes    Alcohol/week: 5.0 standard drinks of alcohol    Types: 5 Glasses of wine per week    Comment: no use since 10/2019   Drug use: Never   Sexual activity: Yes    Birth control/protection: I.U.D.  Other Topics Concern   Not on file  Social History Narrative   Lives at home with husband and daughter.   Right-handed.   2 cups caffeine per day (tea).   Social Determinants of Health   Financial Resource Strain: Not on file  Food Insecurity: Not on file  Transportation Needs: Not on file  Physical Activity: Not on file  Stress: Not on file  Social Connections: Not on file  Intimate Partner Violence: Not on file    Outpatient Medications Prior to Visit  Medication Sig Dispense Refill   Cetirizine HCl (ZYRTEC ALLERGY) 10 MG TBDP      Lisdexamfetamine Dimesylate (VYVANSE) 60 MG CHEW Chew by mouth.      sertraline (ZOLOFT) 100 MG tablet TAKE 1 TABLET BY MOUTH EVERY DAY 90 tablet 3   Vitamin D, Ergocalciferol, (DRISDOL) 1.25 MG (50000 UNIT) CAPS capsule Take 1 capsule (50,000 Units total) by mouth every 7 (seven) days. 4 capsule 2   No facility-administered medications prior to visit.    Allergies  Allergen Reactions   Amoxicillin Anaphylaxis    Has patient had a PCN reaction causing immediate rash, facial/tongue/throat swelling, SOB or lightheadedness with hypotension: Yes Has patient had a PCN reaction causing severe rash involving mucus membranes or skin necrosis: No Has patient had a PCN reaction that required hospitalization Yes Has patient had a PCN reaction occurring within the last 10 years: No If all of the above answers are "NO", then may proceed with Cephalosporin use.     Review of Systems  Constitutional:  Positive for malaise/fatigue. Negative for chills and fever.  Respiratory:  Negative for shortness of breath.   Cardiovascular:  Negative for chest pain, palpitations and leg swelling.  Gastrointestinal:  Negative for abdominal pain, constipation, diarrhea, nausea and vomiting.  Genitourinary:  Negative for dysuria, frequency and urgency.  Neurological:  Negative for dizziness.       Objective:    Physical Exam Constitutional:      General: She is not in acute distress.    Appearance: She is not ill-appearing.  Eyes:     Extraocular Movements: Extraocular movements intact.     Conjunctiva/sclera: Conjunctivae normal.  Cardiovascular:     Rate and Rhythm: Normal rate.  Pulmonary:     Effort: Pulmonary effort is normal.  Musculoskeletal:     Cervical back: Normal range of motion and neck supple.  Skin:    General: Skin is warm and dry.  Neurological:     General: No focal deficit present.     Mental Status: She is alert and oriented to person, place, and time.  Psychiatric:        Mood and Affect: Mood normal.        Behavior: Behavior normal.         Thought Content: Thought content normal.     BP (!) 134/90 (BP Location: Left Arm, Patient Position: Sitting, Cuff Size: Large)   Pulse (!) 103   Temp 97.6 F (36.4 C) (Temporal)   Ht 5\' 2"  (1.575 m)   Wt 180 lb (81.6 kg)   SpO2 99%   BMI 32.92 kg/m  Wt Readings from Last 3 Encounters:  01/03/23 180 lb (81.6 kg)  12/29/22 180 lb (81.6 kg)  01/27/20 145 lb (65.8 kg)       Assessment & Plan:   Problem List Items Addressed This Visit       Other   Brain fog   Fatigue - Primary   Vitamin D deficiency   Other Visit Diagnoses     ANA positive          Reviewed labs in person from previous visit.  She is awaiting rheumatology appointment to discuss positive ANA result.  She is taking prescription vitamin D for severe vitamin D deficiency.  She has already history of ADD diagnosed at age 72.  I do not have the documentation here but she has it at home.  She has tried several stimulants including Adderall, Vyvanse and Mydayis in the past and she is not happy with how she feels on the medications. Reports the medication does not last long and having fatigue and brain fog when the medications wear off.  I recommend scheduling for ADD testing again as an adult to determine if this is actually ADD she is dealing with or something else.  Counseling on preventing altitude sickness by being well hydrated, avoiding alcohol or stimulants and taking ibuprofen. She has not experienced this before so I see no reason why she would have this issue on her trip. Counseling on symptoms and when to get to a lower altitude.   I am having Jaelyne Biondolillo maintain her sertraline, ZyrTEC Allergy, Lisdexamfetamine Dimesylate, and Vitamin D (Ergocalciferol).  No orders of the defined types were placed in this encounter.

## 2023-01-03 NOTE — Patient Instructions (Signed)
   You may call to schedule with one of these specialists:    [facility] [place], [place] [address]  [facility]  [address] [place], [place] [address] [phone]

## 2023-01-04 ENCOUNTER — Other Ambulatory Visit (HOSPITAL_BASED_OUTPATIENT_CLINIC_OR_DEPARTMENT_OTHER): Payer: Self-pay | Admitting: Family Medicine

## 2023-01-04 DIAGNOSIS — Z1231 Encounter for screening mammogram for malignant neoplasm of breast: Secondary | ICD-10-CM

## 2023-01-05 ENCOUNTER — Ambulatory Visit (HOSPITAL_BASED_OUTPATIENT_CLINIC_OR_DEPARTMENT_OTHER)
Admission: RE | Admit: 2023-01-05 | Discharge: 2023-01-05 | Disposition: A | Discharge status: Home / Self Care | Payer: Managed Care, Other (non HMO) | Source: Ambulatory Visit

## 2023-01-05 ENCOUNTER — Encounter: Payer: Self-pay | Admitting: Family Medicine

## 2023-01-05 DIAGNOSIS — Z1231 Encounter for screening mammogram for malignant neoplasm of breast: Secondary | ICD-10-CM | POA: Diagnosis present

## 2023-01-05 DIAGNOSIS — R768 Other specified abnormal immunological findings in serum: Secondary | ICD-10-CM

## 2023-01-10 ENCOUNTER — Other Ambulatory Visit: Payer: Self-pay | Admitting: Family Medicine

## 2023-01-10 DIAGNOSIS — R928 Other abnormal and inconclusive findings on diagnostic imaging of breast: Secondary | ICD-10-CM

## 2023-01-19 ENCOUNTER — Ambulatory Visit
Admission: RE | Admit: 2023-01-19 | Discharge: 2023-01-19 | Disposition: A | Discharge status: Home / Self Care | Payer: Managed Care, Other (non HMO) | Source: Ambulatory Visit | Attending: Family Medicine | Admitting: Family Medicine

## 2023-01-19 ENCOUNTER — Other Ambulatory Visit: Payer: Self-pay | Admitting: Family Medicine

## 2023-01-19 DIAGNOSIS — R928 Other abnormal and inconclusive findings on diagnostic imaging of breast: Secondary | ICD-10-CM

## 2023-01-19 DIAGNOSIS — R599 Enlarged lymph nodes, unspecified: Secondary | ICD-10-CM

## 2023-01-19 DIAGNOSIS — N6310 Unspecified lump in the right breast, unspecified quadrant: Secondary | ICD-10-CM

## 2023-01-23 ENCOUNTER — Ambulatory Visit
Admission: RE | Admit: 2023-01-23 | Discharge: 2023-01-23 | Disposition: A | Discharge status: Home / Self Care | Payer: Managed Care, Other (non HMO) | Source: Ambulatory Visit | Attending: Family Medicine | Admitting: Family Medicine

## 2023-01-23 DIAGNOSIS — R928 Other abnormal and inconclusive findings on diagnostic imaging of breast: Secondary | ICD-10-CM

## 2023-01-23 DIAGNOSIS — R599 Enlarged lymph nodes, unspecified: Secondary | ICD-10-CM

## 2023-01-23 DIAGNOSIS — N6310 Unspecified lump in the right breast, unspecified quadrant: Secondary | ICD-10-CM

## 2023-01-23 HISTORY — PX: BREAST BIOPSY: SHX20

## 2023-01-26 ENCOUNTER — Encounter: Payer: 59 | Admitting: Radiology

## 2023-02-08 ENCOUNTER — Ambulatory Visit: Payer: Self-pay | Admitting: Family Medicine

## 2023-02-28 ENCOUNTER — Encounter: Payer: 59 | Admitting: Nurse Practitioner

## 2023-03-01 ENCOUNTER — Ambulatory Visit: Payer: Managed Care, Other (non HMO) | Admitting: Family Medicine

## 2023-03-02 ENCOUNTER — Encounter: Payer: Self-pay | Admitting: Family Medicine

## 2023-03-02 ENCOUNTER — Ambulatory Visit: Payer: Managed Care, Other (non HMO) | Admitting: Family Medicine

## 2023-03-02 VITALS — BP 115/82 | HR 91 | Ht 62.0 in | Wt 172.6 lb

## 2023-03-02 DIAGNOSIS — R6889 Other general symptoms and signs: Secondary | ICD-10-CM

## 2023-03-02 DIAGNOSIS — R5383 Other fatigue: Secondary | ICD-10-CM

## 2023-03-02 DIAGNOSIS — L659 Nonscarring hair loss, unspecified: Secondary | ICD-10-CM

## 2023-03-02 DIAGNOSIS — E559 Vitamin D deficiency, unspecified: Secondary | ICD-10-CM | POA: Diagnosis not present

## 2023-03-02 DIAGNOSIS — R768 Other specified abnormal immunological findings in serum: Secondary | ICD-10-CM

## 2023-03-02 LAB — VITAMIN D 25 HYDROXY (VIT D DEFICIENCY, FRACTURES): VITD: 35.8 ng/mL (ref 30.00–100.00)

## 2023-03-02 NOTE — Progress Notes (Signed)
Subjective:     Patient ID: Dominique Powers, female    DOB: 05-May-1981, 42 y.o.   MRN: [id number]  No chief complaint on file.   HPI Patient is in today for follow up on fatigue, heat intolerance, hair thinning and vitamin D def. Currently taking prescription vitamin D weekly.   States she saw [facility] in April and was told that she does not have an autoimmune condition.    She has lost 8 lbs.   [facility] next week for ADHD testing.   States mood is good. Less stress than in past.   Health Maintenance Due  Topic Date Due   COVID-19 Vaccine (1) Never done   Hepatitis C Screening  Never done   PAP SMEAR-Modifier  10/20/2018    Past Medical History:  Diagnosis Date   ADD (attention deficit disorder)    Alcohol use complicating pregnancy in first trimester 10/28/2015   Formatting of this note might be different from the original. 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    Hypertension    Postpartum care following cesarean delivery (6/22) 04/07/2016    Past Surgical History:  Procedure Laterality Date   BREAST BIOPSY Right 01/23/2023   [place] RT BREAST BX W LOC DEV 1ST LESION IMG BX SPEC US GUIDE 01/23/2023 [facility] MAMMOGRAPHY   CESAREAN SECTION N/A 04/07/2016   Procedure: CESAREAN SECTION;  Surgeon: Shea Evans, MD;  [facility];  Service: Obstetrics;  Laterality: N/A;   MOLE REMOVAL     WISDOM TOOTH EXTRACTION      Family History  Problem Relation Age of Onset   Hyperlipidemia Mother    Other Mother        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   Hypertension Mother    Depression Mother    ADD / ADHD Mother    Alcohol abuse Mother    Hemophilia Cousin        maternal female cousin   Healthy Father    ADD / ADHD Sister    Learning disabilities Sister    Hemophilia Cousin         maternal female cousin   Diabetes Maternal Grandmother    Diabetes Paternal Grandfather    Stroke Sister     Social History   Socioeconomic History   Marital status: Married    Spouse name: Not on file   Number of children: 1   Years of education: college   Highest education level: Master's degree (e.g., MA, MS, MEng, MEd, MSW, MBA)  Occupational History   Occupation: Teacher  Tobacco Use   Smoking status: Never   Smokeless tobacco: Never  Substance and Sexual Activity   Alcohol use: Yes    Alcohol/week: 5.0 standard drinks of alcohol    Types: 5 Glasses of wine per week    Comment: no use since 10/2019   Drug use: Never   Sexual activity: Yes    Birth control/protection: I.U.D.  Other Topics Concern   Not on file  Social History Narrative   Lives at home with husband and daughter.   Right-handed.   2 cups caffeine per day (tea).   Social Determinants of Health   Financial Resource Strain: Low Risk  (03/01/2023)   Overall Financial Resource Strain (CARDIA)    Difficulty  of Paying Living Expenses: Not hard at all  Food Insecurity: No Food Insecurity (03/01/2023)   Hunger Vital Sign    Worried About Running Out of Food in the Last Year: Never true    Ran Out of Food in the Last Year: Never true  Transportation Needs: No Transportation Needs (03/01/2023)   PRAPARE - Administrator, Civil Service (Medical): No    Lack of Transportation (Non-Medical): No  Physical Activity: Insufficiently Active (03/01/2023)   Exercise Vital Sign    Days of Exercise per Week: 4 days    Minutes of Exercise per Session: 20 min  Stress: Stress Concern Present (03/01/2023)   [employer] of Occupational Health - Occupational Stress Questionnaire    Feeling of Stress : To some extent  Social Connections: Moderately Isolated (03/01/2023)   Social Connection and Isolation Panel [NHANES]    Frequency of Communication with Friends and Family: More than three times a week     Frequency of Social Gatherings with Friends and Family: More than three times a week    Attends Religious Services: Never    Database administrator or Organizations: No    Attends Engineer, structural: Not on file    Marital Status: Married  Catering manager Violence: Not on file    Outpatient Medications Prior to Visit  Medication Sig Dispense Refill   amphetamine-dextroamphetamine (ADDERALL) 30 MG tablet Take 30 mg by mouth 2 (two) times daily.     Cetirizine HCl (ZYRTEC ALLERGY) 10 MG TBDP      sertraline (ZOLOFT) 100 MG tablet TAKE 1 TABLET BY MOUTH EVERY DAY 90 tablet 3   Vitamin D, Ergocalciferol, (DRISDOL) 1.25 MG (50000 UNIT) CAPS capsule Take 1 capsule (50,000 Units total) by mouth every 7 (seven) days. 4 capsule 2   Lisdexamfetamine Dimesylate (VYVANSE) 60 MG CHEW Chew by mouth. (Patient not taking: Reported on 03/02/2023)     No facility-administered medications prior to visit.    Allergies  Allergen Reactions   Amoxicillin Anaphylaxis    Has patient had a PCN reaction causing immediate rash, facial/tongue/throat swelling, SOB or lightheadedness with hypotension: Yes Has patient had a PCN reaction causing severe rash involving mucus membranes or skin necrosis: No Has patient had a PCN reaction that required hospitalization Yes Has patient had a PCN reaction occurring within the last 10 years: No If all of the above answers are "NO", then may proceed with Cephalosporin use.     Review of Systems  Constitutional:  Positive for malaise/fatigue. Negative for chills and fever.  Respiratory:  Negative for shortness of breath.   Cardiovascular:  Negative for chest pain, palpitations and leg swelling.  Gastrointestinal:  Negative for abdominal pain, nausea and vomiting.  Genitourinary:  Negative for dysuria, frequency and urgency.  Neurological:  Negative for dizziness.       Objective:    Physical Exam Constitutional:      General: She is not in acute  distress.    Appearance: She is not ill-appearing.  Eyes:     Conjunctiva/sclera: Conjunctivae normal.  Musculoskeletal:     Cervical back: Normal range of motion and neck supple.  Skin:    General: Skin is warm and dry.  Neurological:     General: No focal deficit present.     Mental Status: She is alert and oriented to person, place, and time.  Psychiatric:        Mood and Affect: Mood normal.  Behavior: Behavior normal.        Thought Content: Thought content normal.     BP 115/82   Pulse (!) 123   Ht 5\' 2"  (1.575 m)   Wt 172 lb 9.6 oz (78.3 kg)   SpO2 98%   BMI 31.57 kg/m  Wt Readings from Last 3 Encounters:  03/02/23 172 lb 9.6 oz (78.3 kg)  01/03/23 180 lb (81.6 kg)  12/29/22 180 lb (81.6 kg)       Assessment & Plan:   Problem List Items Addressed This Visit       Other   Fatigue   Vitamin D deficiency - Primary   Relevant Orders   VITAMIN D 25 Hydroxy (Vit-D Deficiency, Fractures) (Completed)   Other Visit Diagnoses     Thinning hair       Relevant Orders   Zinc   Ambulatory referral to Dermatology   Heat intolerance       ANA positive          Slightly anxious. So far, we have not been able to determine etiology for symptoms. Recent visit to rheumatologist and patient advised she does not have underlying autoimmune condition. Will review notes and request if needed. She is concerned about hair thinning. Referral to dermatologist.  She has lost 8 lbs since our last visit, intentionally. Vitamin D def and has taken 8 weeks of high does vitamin D. Requests recheck of vitamin D level. I will also add a zinc level.      I am having Saylah Nadal maintain her sertraline, ZyrTEC Allergy, Lisdexamfetamine Dimesylate, Vitamin D (Ergocalciferol), and amphetamine-dextroamphetamine.  No orders of the defined types were placed in this encounter.

## 2023-03-02 NOTE — Patient Instructions (Addendum)
Please go downstairs for labs.   You will hear from the dermatology office.   Start taking a prenatal vitamin daily

## 2023-03-08 ENCOUNTER — Other Ambulatory Visit (HOSPITAL_BASED_OUTPATIENT_CLINIC_OR_DEPARTMENT_OTHER): Payer: Self-pay

## 2023-03-08 MED ORDER — ATOMOXETINE HCL 25 MG PO CAPS
ORAL_CAPSULE | ORAL | 0 refills | Status: DC
  Filled 2023-03-08: qty 60, 30d supply, fill #0

## 2023-03-08 MED ORDER — LISDEXAMFETAMINE DIMESYLATE 60 MG PO CHEW
1.0000 | CHEWABLE_TABLET | Freq: Every day | ORAL | 0 refills | Status: DC
  Filled 2023-03-08 – 2023-03-10 (×3): qty 30, 30d supply, fill #0

## 2023-03-09 ENCOUNTER — Other Ambulatory Visit: Payer: Self-pay

## 2023-03-09 ENCOUNTER — Encounter: Payer: Self-pay | Admitting: Family Medicine

## 2023-03-09 LAB — ZINC: Zinc: 75 ug/dL (ref 60–130)

## 2023-03-10 ENCOUNTER — Other Ambulatory Visit: Payer: Self-pay

## 2023-03-10 ENCOUNTER — Other Ambulatory Visit (HOSPITAL_BASED_OUTPATIENT_CLINIC_OR_DEPARTMENT_OTHER): Payer: Self-pay

## 2023-04-07 ENCOUNTER — Other Ambulatory Visit: Payer: Self-pay

## 2023-04-07 ENCOUNTER — Other Ambulatory Visit (HOSPITAL_BASED_OUTPATIENT_CLINIC_OR_DEPARTMENT_OTHER): Payer: Self-pay

## 2023-04-07 MED ORDER — LISDEXAMFETAMINE DIMESYLATE 60 MG PO CHEW
60.0000 mg | CHEWABLE_TABLET | Freq: Every day | ORAL | 0 refills | Status: DC
  Filled 2023-04-07: qty 30, 30d supply, fill #0

## 2023-04-13 ENCOUNTER — Encounter: Payer: Self-pay | Admitting: Family Medicine

## 2023-04-13 MED ORDER — SERTRALINE HCL 100 MG PO TABS: 100.0000 mg | ORAL_TABLET | Freq: Every day | ORAL | 0 refills | Status: DC

## 2023-04-13 NOTE — Telephone Encounter (Signed)
Pt on vacation and is short on her zoloft, is wondering if refill can be sent in

## 2023-04-20 ENCOUNTER — Encounter: Payer: Self-pay | Admitting: Family Medicine

## 2023-04-20 ENCOUNTER — Other Ambulatory Visit: Payer: Self-pay | Admitting: Family Medicine

## 2023-04-20 DIAGNOSIS — E559 Vitamin D deficiency, unspecified: Secondary | ICD-10-CM

## 2023-04-21 NOTE — Telephone Encounter (Signed)
fyi

## 2023-04-21 NOTE — Telephone Encounter (Signed)
Pt has question regarding vitamin D rx and if she should go back on it

## 2023-04-27 ENCOUNTER — Ambulatory Visit: Payer: Managed Care, Other (non HMO) | Admitting: Family Medicine

## 2023-04-27 ENCOUNTER — Encounter: Payer: Self-pay | Admitting: Family Medicine

## 2023-04-27 VITALS — BP 126/84 | HR 99 | Temp 97.6°F | Ht 62.0 in | Wt 176.0 lb

## 2023-04-27 DIAGNOSIS — R5383 Other fatigue: Secondary | ICD-10-CM

## 2023-04-27 DIAGNOSIS — R197 Diarrhea, unspecified: Secondary | ICD-10-CM | POA: Diagnosis not present

## 2023-04-27 DIAGNOSIS — J3089 Other allergic rhinitis: Secondary | ICD-10-CM | POA: Insufficient documentation

## 2023-04-27 DIAGNOSIS — E559 Vitamin D deficiency, unspecified: Secondary | ICD-10-CM

## 2023-04-27 DIAGNOSIS — F32A Depression, unspecified: Secondary | ICD-10-CM

## 2023-04-27 LAB — COMPREHENSIVE METABOLIC PANEL
ALT: 23 U/L (ref 0–35)
AST: 20 U/L (ref 0–37)
Albumin: 4.6 g/dL (ref 3.5–5.2)
Alkaline Phosphatase: 69 U/L (ref 39–117)
BUN: 12 mg/dL (ref 6–23)
CO2: 28 mEq/L (ref 19–32)
Calcium: 10.3 mg/dL (ref 8.4–10.5)
Chloride: 100 mEq/L (ref 96–112)
Creatinine, Ser: 0.82 mg/dL (ref 0.40–1.20)
GFR: 87.98 mL/min (ref >60.00)
Glucose, Bld: 105 mg/dL — ABNORMAL HIGH (ref 70–99)
Potassium: 4.5 mEq/L (ref 3.5–5.1)
Sodium: 135 mEq/L (ref 135–145)
Total Bilirubin: 0.4 mg/dL (ref 0.2–1.2)
Total Protein: 7.7 g/dL (ref 6.0–8.3)

## 2023-04-27 LAB — CBC WITH DIFFERENTIAL/PLATELET
Basophils Absolute: 0 10*3/uL (ref 0.0–0.1)
Basophils Relative: 0.5 % (ref 0.0–3.0)
Eosinophils Absolute: 0.2 10*3/uL (ref 0.0–0.7)
Eosinophils Relative: 1.9 % (ref 0.0–5.0)
HCT: 46 % (ref 36.0–46.0)
Hemoglobin: 15.1 g/dL — ABNORMAL HIGH (ref 12.0–15.0)
Lymphocytes Relative: 31.9 % (ref 12.0–46.0)
Lymphs Abs: 2.6 10*3/uL (ref 0.7–4.0)
MCHC: 32.9 g/dL (ref 30.0–36.0)
MCV: 88.8 fl (ref 78.0–100.0)
Monocytes Absolute: 0.6 10*3/uL (ref 0.1–1.0)
Monocytes Relative: 6.8 % (ref 3.0–12.0)
Neutro Abs: 4.8 10*3/uL (ref 1.4–7.7)
Neutrophils Relative %: 58.9 % (ref 43.0–77.0)
Platelets: 254 10*3/uL (ref 150.0–400.0)
RBC: 5.18 Mil/uL — ABNORMAL HIGH (ref 3.87–5.11)
RDW: 13.9 % (ref 11.5–15.5)
WBC: 8.2 10*3/uL (ref 4.0–10.5)

## 2023-04-27 LAB — TSH: TSH: 2.13 u[IU]/mL (ref 0.35–5.50)

## 2023-04-27 LAB — VITAMIN D 25 HYDROXY (VIT D DEFICIENCY, FRACTURES): VITD: 31.21 ng/mL (ref 30.00–100.00)

## 2023-04-27 LAB — VITAMIN B12: Vitamin B-12: 559 pg/mL (ref 211–911)

## 2023-04-27 NOTE — Assessment & Plan Note (Signed)
No acute distress. Benign abdominal exam. Hydrate. Check labs and stool studies.

## 2023-04-27 NOTE — Assessment & Plan Note (Signed)
Check vitamin D level and replace as needed.  

## 2023-04-27 NOTE — Patient Instructions (Signed)
Please go downstairs for labs before you leave.  Let me know if you have any new or worsening symptoms.

## 2023-04-27 NOTE — Assessment & Plan Note (Signed)
Continue antihistamine. 

## 2023-04-27 NOTE — Assessment & Plan Note (Signed)
Continue sertraline 

## 2023-04-27 NOTE — Progress Notes (Signed)
Subjective:     Patient ID: Wilmer Floor, female    DOB: 12/08/1980, 42 y.o.   MRN: [id number]  Chief Complaint  Patient presents with   Fatigue    Low energy, recently stopped taking vit D so not sure if its that   Diarrhea    Last 2 weeks, not sure if its due to starting new multivitamin OTC or mucinex D, reports it is on and off which is what makes her think its something she is taking    HPI  Discussed the use of AI scribe software for clinical note transcription with the patient, who gave verbal consent to proceed.  History of Present Illness         C/o a 3 wk hx of fatigue.  Needing naps.   She started Vyvanse 4 wks ago  Hx of vitamin D def  Taking prenatal vitamin with iron.    Mucinex D for allergies.   Diarrhea x 2 wks. 3 episodes per day on average.   Denies abdominal pain.   Denies fever, chills, dizziness, chest pain, palpitations, shortness of breath, nausea, vomiting, urinary symptoms.      Health Maintenance Due  Topic Date Due   Hepatitis C Screening  Never done   PAP SMEAR-Modifier  10/20/2018    Past Medical History:  Diagnosis Date   ADD (attention deficit disorder)    Alcohol use complicating pregnancy in first trimester 10/28/2015   Formatting of this note might be different from the original. 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    Hypertension    Postpartum care following cesarean delivery (6/22) 04/07/2016    Past Surgical History:  Procedure Laterality Date   BREAST BIOPSY Right 01/23/2023   [place] RT BREAST BX W LOC DEV 1ST LESION IMG BX SPEC US GUIDE 01/23/2023 [facility]MO[facility]APHY   CESAREAN SECTION N/A 04/07/2016   Procedure: CESAREAN SECTION;  Surgeon: Shea Evans, MD;  [facility]: [facility];  Service: Obstetrics;  Laterality: N/A;   MOLE REMOVAL     WISDOM  TOOTH EXTRACTION      Family History  Problem Relation Age of Onset   Hyperlipidemia Mother    Other Mother        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   Hypertension Mother    Depression Mother    ADD / ADHD Mother    Alcohol abuse Mother    Hemophilia Cousin        maternal female cousin   Healthy Father    ADD / ADHD Sister    Learning disabilities Sister    Hemophilia Cousin        maternal female cousin   Diabetes Maternal Grandmother    Diabetes Paternal Grandfather    Stroke Sister     Social History   Socioeconomic History   Marital status: Married    Spouse name: Not on file   Number of children: 1   Years of education: college   Highest education level: Master's degree (e.g., MA, MS, MEng, MEd, MSW, MBA)  Occupational History   Occupation: Teacher  Tobacco Use   Smoking status: Never   Smokeless tobacco: Never  Substance and Sexual Activity   Alcohol use: Yes    Alcohol/week: 5.0 standard drinks of alcohol    Types: 5  Glasses of wine per week    Comment: no use since 10/2019   Drug use: Never   Sexual activity: Yes    Birth control/protection: I.U.D.  Other Topics Concern   Not on file  Social History Narrative   Lives at home with husband and daughter.   Right-handed.   2 cups caffeine per day (tea).   Social Determinants of Health   Financial Resource Strain: Low Risk  (03/01/2023)   Overall Financial Resource Strain (CARDIA)    Difficulty of Paying Living Expenses: Not hard at all  Food Insecurity: No Food Insecurity (03/01/2023)   Hunger Vital Sign    Worried About Running Out of Food in the Last Year: Never true    Ran Out of Food in the Last Year: Never true  Transportation Needs: No Transportation Needs (03/01/2023)   PRAPARE - Administrator, Civil Service (Medical): No    Lack of Transportation (Non-Medical): No  Physical Activity: Insufficiently Active (03/01/2023)   Exercise Vital Sign    Days of Exercise per Week: 4 days    Minutes of  Exercise per Session: 20 min  Stress: Stress Concern Present (03/01/2023)   [employer] of Occupational Health - Occupational Stress Questionnaire    Feeling of Stress : To some extent  Social Connections: Moderately Isolated (03/01/2023)   Social Connection and Isolation Panel [NHANES]    Frequency of Communication with Friends and Family: More than three times a week    Frequency of Social Gatherings with Friends and Family: More than three times a week    Attends Religious Services: Never    Database administrator or Organizations: No    Attends Engineer, structural: Not on file    Marital Status: Married  Catering manager Violence: Not on file    Outpatient Medications Prior to Visit  Medication Sig Dispense Refill   Cetirizine HCl (ZYRTEC ALLERGY) 10 MG TBDP      Lisdexamfetamine Dimesylate (VYVANSE) 60 MG CHEW Chew by mouth.     Lisdexamfetamine Dimesylate 60 MG CHEW Chew 1 tablet (60 mg total) by mouth daily. 30 tablet 0   sertraline (ZOLOFT) 100 MG tablet Take 1 tablet (100 mg total) by mouth daily. 90 tablet 0   Vitamin D, Ergocalciferol, (DRISDOL) 1.25 MG (50000 UNIT) CAPS capsule Take 1 capsule (50,000 Units total) by mouth every 7 (seven) days. (Patient not taking: Reported on 04/27/2023) 4 capsule 2   amphetamine-dextroamphetamine (ADDERALL) 30 MG tablet Take 30 mg by mouth 2 (two) times daily. (Patient not taking: Reported on 04/27/2023)     atomoxetine (STRATTERA) 25 MG capsule Take 1 capsule daily for 1 week and then increase to 2 capsules daily. Take with a large meal. (Patient not taking: Reported on 04/27/2023) 60 capsule 0   No facility-administered medications prior to visit.    Allergies  Allergen Reactions   Amoxicillin Anaphylaxis    Has patient had a PCN reaction causing immediate rash, facial/tongue/throat swelling, SOB or lightheadedness with hypotension: Yes Has patient had a PCN reaction causing severe rash involving mucus membranes or skin  necrosis: No Has patient had a PCN reaction that required hospitalization Yes Has patient had a PCN reaction occurring within the last 10 years: No If all of the above answers are "NO", then may proceed with Cephalosporin use.     Review of Systems  Constitutional:  Positive for malaise/fatigue. Negative for chills and fever.  Respiratory:  Negative for shortness of breath.  Cardiovascular:  Negative for chest pain and palpitations.  Gastrointestinal:  Positive for blood in stool and diarrhea. Negative for abdominal pain, constipation, nausea and vomiting.  Genitourinary:  Negative for dysuria, frequency and urgency.  Neurological:  Negative for dizziness and weakness.       Objective:    Physical Exam Constitutional:      General: She is not in acute distress.    Appearance: She is not ill-appearing.  HENT:     Mouth/Throat:     Mouth: Mucous membranes are moist.  Eyes:     Extraocular Movements: Extraocular movements intact.     Conjunctiva/sclera: Conjunctivae normal.  Cardiovascular:     Rate and Rhythm: Normal rate and regular rhythm.  Pulmonary:     Effort: Pulmonary effort is normal.     Breath sounds: Normal breath sounds.  Abdominal:     General: Bowel sounds are normal. There is no distension.     Palpations: Abdomen is soft.     Tenderness: There is no abdominal tenderness. There is no guarding or rebound.  Musculoskeletal:     Cervical back: Normal range of motion and neck supple. No tenderness.  Lymphadenopathy:     Cervical: No cervical adenopathy.  Skin:    General: Skin is warm and dry.  Neurological:     General: No focal deficit present.     Mental Status: She is alert and oriented to person, place, and time.     Cranial Nerves: No cranial nerve deficit.     Motor: No weakness.     Gait: Gait normal.  Psychiatric:        Mood and Affect: Mood normal.        Behavior: Behavior normal.        Thought Content: Thought content normal.      BP  126/84 (BP Location: Left Arm, Patient Position: Sitting, Cuff Size: Large)   Pulse 99   Temp 97.6 F (36.4 C) (Temporal)   Ht 5\' 2"  (1.575 m)   Wt 176 lb (79.8 kg)   SpO2 100%   BMI 32.19 kg/m  Wt Readings from Last 3 Encounters:  04/27/23 176 lb (79.8 kg)  03/02/23 172 lb 9.6 oz (78.3 kg)  01/03/23 180 lb (81.6 kg)       Assessment & Plan:   Problem List Items Addressed This Visit       Other   Depression    Continue sertraline      Diarrhea    No acute distress. Benign abdominal exam. Hydrate. Check labs and stool studies.       Relevant Orders   CBC with Differential/Platelet (Completed)   Comprehensive metabolic panel (Completed)   GI Profile, Stool, PCR   Environmental and seasonal allergies    Continue antihistamine.       Fatigue    Check labs. Most likely multifactorial.       Relevant Orders   CBC with Differential/Platelet (Completed)   Comprehensive metabolic panel (Completed)   TSH (Completed)   Vitamin B12 (Completed)   Vitamin D deficiency - Primary    Check vitamin D level and replace as needed.       Relevant Orders   VITAMIN D 25 Hydroxy (Vit-D Deficiency, Fractures) (Completed)    I have discontinued Jovi Hammerstrom's amphetamine-dextroamphetamine and atomoxetine. I am also having her maintain her ZyrTEC Allergy, Lisdexamfetamine Dimesylate, Vitamin D (Ergocalciferol), Lisdexamfetamine Dimesylate, and sertraline.  No orders of the defined types were placed in this encounter.

## 2023-04-27 NOTE — Assessment & Plan Note (Signed)
Check labs. Most likely multifactorial.

## 2023-05-08 ENCOUNTER — Other Ambulatory Visit (HOSPITAL_BASED_OUTPATIENT_CLINIC_OR_DEPARTMENT_OTHER): Payer: Self-pay

## 2023-05-08 MED ORDER — LISDEXAMFETAMINE DIMESYLATE 60 MG PO CHEW
60.0000 mg | CHEWABLE_TABLET | Freq: Every day | ORAL | 0 refills | Status: DC
  Filled 2023-05-08: qty 30, 30d supply, fill #0

## 2023-05-09 ENCOUNTER — Other Ambulatory Visit (HOSPITAL_COMMUNITY)
Admission: RE | Admit: 2023-05-09 | Discharge: 2023-05-09 | Disposition: A | Discharge status: Home / Self Care | Payer: Managed Care, Other (non HMO) | Source: Ambulatory Visit | Attending: Obstetrics and Gynecology | Admitting: Obstetrics and Gynecology

## 2023-05-09 ENCOUNTER — Other Ambulatory Visit (HOSPITAL_BASED_OUTPATIENT_CLINIC_OR_DEPARTMENT_OTHER): Payer: Self-pay

## 2023-05-09 ENCOUNTER — Encounter: Payer: Self-pay | Admitting: Obstetrics and Gynecology

## 2023-05-09 ENCOUNTER — Ambulatory Visit: Payer: Managed Care, Other (non HMO) | Admitting: Obstetrics and Gynecology

## 2023-05-09 VITALS — BP 127/85 | HR 82 | Ht 62.0 in | Wt 180.0 lb

## 2023-05-09 DIAGNOSIS — Z01419 Encounter for gynecological examination (general) (routine) without abnormal findings: Secondary | ICD-10-CM | POA: Insufficient documentation

## 2023-05-09 DIAGNOSIS — N761 Subacute and chronic vaginitis: Secondary | ICD-10-CM | POA: Insufficient documentation

## 2023-05-09 MED ORDER — NYSTATIN-TRIAMCINOLONE 100000-0.1 UNIT/GM-% EX OINT: 1.0000 | TOPICAL_OINTMENT | Freq: Two times a day (BID) | CUTANEOUS | 0 refills | Status: DC

## 2023-05-09 NOTE — Progress Notes (Signed)
Subjective:     Dominique Powers is a 41 y.o. female P1 with LMP 7/9 and BMI 31 who is here for a comprehensive physical exam. The patient reports some intermittent vaginal irritation. She denies any abnormal discharge and admits to some occasional pruritus. Patient reports a monthly periods lasting 4-5 days. She is sexually active without dyspareunia. She uses Paraguard for contraception which was inserted in 2017. Patient denies any urinary incontinence. She is without any other complaints.   Past Medical History:  Diagnosis Date   ADD (attention deficit disorder)    Alcohol use complicating pregnancy in first trimester 10/28/2015   Formatting of this note might be different from the original. 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    Hypertension    Postpartum care following cesarean delivery (6/22) 04/07/2016   Past Surgical History:  Procedure Laterality Date   BREAST BIOPSY Right 01/23/2023   [place] RT BREAST BX W LOC DEV 1ST LESION IMG BX SPEC US GUIDE 01/23/2023 [facility]AMMOGRAPHY   CESAREAN SECTION N/A 04/07/2016   Procedure: CESAREAN SECTION;  Surgeon: Shea Evans, MD;  Location: [facility];  Service: Obstetrics;  Laterality: N/A;   MOLE REMOVAL     WISDOM TOOTH EXTRACTION     Family History  Problem Relation Age of Onset   Hyperlipidemia Mother    Other Mother        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   Hypertension Mother    Depression Mother    ADD / ADHD Mother    Alcohol abuse Mother    Hemophilia Cousin        maternal female cousin   Healthy Father    ADD / ADHD Sister    Learning disabilities Sister    Hemophilia Cousin        maternal female cousin   Diabetes Maternal Grandmother    Diabetes Paternal Grandfather    Stroke Sister     Social History   Socioeconomic History   Marital status: Married    Spouse  name: Not on file   Number of children: 1   Years of education: college   Highest education level: Master's degree (e.g., MA, MS, MEng, MEd, MSW, MBA)  Occupational History   Occupation: Teacher  Tobacco Use   Smoking status: Never   Smokeless tobacco: Never  Substance and Sexual Activity   Alcohol use: Yes    Alcohol/week: 5.0 standard drinks of alcohol    Types: 5 Glasses of wine per week    Comment: no use since 10/2019   Drug use: Never   Sexual activity: Yes    Birth control/protection: I.U.D.  Other Topics Concern   Not on file  Social History Narrative   Lives at home with husband and daughter.   Right-handed.   2 cups caffeine per day (tea).   Social Determinants of Health   Financial Resource Strain: Low Risk  (03/01/2023)   Overall Financial Resource Strain (CARDIA)    Difficulty of Paying Living Expenses: Not hard at all  Food Insecurity: No Food Insecurity (03/01/2023)   Hunger Vital Sign    Worried About Running Out of Food in the Last Year: Never true    Ran Out of Food in the Last Year: Never true  Transportation Needs: No Transportation Needs (03/01/2023)  PRAPARE - Administrator, Civil Service (Medical): No    Lack of Transportation (Non-Medical): No  Physical Activity: Insufficiently Active (03/01/2023)   Exercise Vital Sign    Days of Exercise per Week: 4 days    Minutes of Exercise per Session: 20 min  Stress: Stress Concern Present (03/01/2023)   [employer] of Occupational Health - Occupational Stress Questionnaire    Feeling of Stress : To some extent  Social Connections: Moderately Isolated (03/01/2023)   Social Connection and Isolation Panel [NHANES]    Frequency of Communication with Friends and Family: More than three times a week    Frequency of Social Gatherings with Friends and Family: More than three times a week    Attends Religious Services: Never    Database administrator or Organizations: No    Attends Tax inspector Meetings: Not on file    Marital Status: Married  Intimate Partner Violence: Not on file   Health Maintenance  Topic Date Due   Hepatitis C Screening  Never done   PAP SMEAR-Modifier  10/20/2018   COVID-19 Vaccine (1 - 2023-24 season) 05/13/2023 (Originally 06/17/2022)   INFLUENZA VACCINE  05/18/2023   DTaP/Tdap/Td (2 - Td or Tdap) 04/03/2024   HIV Screening  Completed   HPV VACCINES  Aged Out       Review of Systems Pertinent items noted in HPI and remainder of comprehensive ROS otherwise negative.   Objective:  Blood pressure 127/85, pulse 82, height 5\' 2"  (1.575 m), weight 180 lb (81.6 kg), last menstrual period 04/25/2023, currently breastfeeding.   GENERAL: Well-developed, well-nourished female in no acute distress.  HEENT: Normocephalic, atraumatic. Sclerae anicteric.  NECK: Supple. Normal thyroid.  LUNGS: Clear to auscultation bilaterally.  HEART: Regular rate and rhythm. BREASTS: Symmetric in size. No palpable masses or lymphadenopathy, skin changes, or nipple drainage. ABDOMEN: Soft, nontender, nondistended. No organomegaly. PELVIC: Normal external female genitalia. Vagina is pink and rugated.  Normal discharge. Normal appearing cervix. Uterus is normal in size. No adnexal mass or tenderness. Chaperone present during the pelvic exam EXTREMITIES: No cyanosis, clubbing, or edema, 2+ distal pulses.     Assessment:    Healthy female exam.      Plan:    Pap smear collected Vaginal swab collected for BV and yeast Rx Nystatin provided Patient current on mammogram Patient will be contacted with abnormal results See After Visit Summary for Counseling Recommendations

## 2023-05-10 ENCOUNTER — Other Ambulatory Visit (HOSPITAL_BASED_OUTPATIENT_CLINIC_OR_DEPARTMENT_OTHER): Payer: Self-pay

## 2023-05-10 LAB — CERVICOVAGINAL ANCILLARY ONLY
Bacterial Vaginitis (gardnerella): NEGATIVE
Candida Glabrata: NEGATIVE
Candida Vaginitis: NEGATIVE
Comment: NEGATIVE
Comment: NEGATIVE
Comment: NEGATIVE

## 2023-05-10 MED ORDER — ATOMOXETINE HCL 25 MG PO CAPS
ORAL_CAPSULE | ORAL | 0 refills | Status: AC
  Filled 2023-05-10: qty 60, 33d supply, fill #0

## 2023-05-11 LAB — CYTOLOGY - PAP
Adequacy: ABSENT
Comment: NEGATIVE
Diagnosis: NEGATIVE
High risk HPV: NEGATIVE

## 2023-05-25 NOTE — Progress Notes (Deleted)
Office Visit Note  Patient: Dominique Powers             Date of Birth: Jan 01, 1981           MRN: [id number]             PCP: Avanell Shackleton, NP-C Referring: Avanell Shackleton, NP-C Visit Date: 06/08/2023 Occupation: @GUAROCC @  Subjective:  No chief complaint on file.   History of Present Illness: Dominique Powers is a 42 y.o. female ***     Activities of Daily Living:  Patient reports morning stiffness for *** {minute/hour:19697}.   Patient {ACTIONS;DENIES/REPORTS:21021675::"Denies"} nocturnal pain.  Difficulty dressing/grooming: {ACTIONS;DENIES/REPORTS:21021675::"Denies"} Difficulty climbing stairs: {ACTIONS;DENIES/REPORTS:21021675::"Denies"} Difficulty getting out of chair: {ACTIONS;DENIES/REPORTS:21021675::"Denies"} Difficulty using hands for taps, buttons, cutlery, and/or writing: {ACTIONS;DENIES/REPORTS:21021675::"Denies"}  No Rheumatology ROS completed.   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   Hypertension Mother    Depression Mother    ADD / ADHD Mother    Alcohol abuse Mother    Hemophilia Cousin        maternal female cousin   Healthy Father    ADD / ADHD Sister    Learning disabilities Sister    Hemophilia Cousin        maternal female cousin   Diabetes Maternal Grandmother    Diabetes Paternal Grandfather    Stroke Sister    Past Surgical History:  Procedure Laterality Date   BREAST BIOPSY Right 01/23/2023   [place] RT BREAST BX W LOC DEV 1ST LESION IMG BX SPEC US GUIDE 01/23/2023 [facility]   CESAREAN SECTION N/A 04/07/2016   Procedure: CESAREAN SECTION;  Surgeon: Shea Evans, MD;  Location: [facility];  Service: Obstetrics;  Laterality: N/A;   MOLE REMOVAL     WISDOM TOOTH EXTRACTION     Social History   Social History Narrative   Lives at home with husband and daughter.   Right-handed.   2 cups caffeine per day (tea).   Immunization History  Administered Date(s) Administered   Influenza,inj,Quad PF,6+ Mos 11/09/2016, 05/29/2019   Influenza-Unspecified 10/31/2016   Tdap 04/03/2014     Objective: Vital Signs: LMP 04/25/2023 (Approximate)    Physical Exam   Musculoskeletal Exam: ***  CDAI Exam: CDAI Score: -- Patient Global: --; Provider Global: -- Swollen: --; Tender: -- Joint Exam 06/08/2023   No joint exam has been documented for this visit  There is currently no information documented on the homunculus. Go to the Rheumatology activity and complete the homunculus joint exam.  Investigation: No additional findings.  Imaging: No results found.  Recent Labs: Lab Results  Component Value Date   WBC 8.2 04/27/2023   HGB 15.1 (H) 04/27/2023   PLT 254.0 04/27/2023   NA 135 04/27/2023   K 4.5 04/27/2023   CL 100 04/27/2023   CO2 28 04/27/2023   GLUCOSE 105 (H) 04/27/2023   BUN 12 04/27/2023   CREATININE 0.82 04/27/2023   BILITOT 0.4 04/27/2023   ALKPHOS 69 04/27/2023   AST 20 04/27/2023   ALT 23  04/27/2023   PROT 7.7 04/27/2023   ALBUMIN 4.6 04/27/2023   CALCIUM 10.3 04/27/2023   GFRAA 127 06/14/2017   December 29, 2022 ANA 1: 80 NS, 1: 320 centromere, TSH normal April 27, 2023 B12 normal, vitamin D31.21, TSH normal  Speciality Comments: No specialty comments available.  Procedures:  No procedures performed Allergies: Amoxicillin   Assessment / Plan:     Visit Diagnoses: No diagnosis found.  Orders: No orders of the defined types were placed in this encounter.  No orders of the defined types were placed in this encounter.   Face-to-face time spent with patient was *** minutes. Greater than 50% of time was spent in counseling and coordination of care.  Follow-Up Instructions: No follow-ups on file.   Pollyann Savoy, MD  Note - This record has been created using Animal nutritionist.  Chart creation errors have been sought, but may not always  have been located. Such creation errors do not reflect on  the standard of medical care.

## 2023-06-06 ENCOUNTER — Other Ambulatory Visit (HOSPITAL_BASED_OUTPATIENT_CLINIC_OR_DEPARTMENT_OTHER): Payer: Self-pay

## 2023-06-06 MED ORDER — LISDEXAMFETAMINE DIMESYLATE 60 MG PO CHEW
1.0000 | CHEWABLE_TABLET | Freq: Every day | ORAL | 0 refills | Status: DC
  Filled 2023-06-06: qty 30, 30d supply, fill #0

## 2023-06-08 ENCOUNTER — Encounter: Payer: Managed Care, Other (non HMO) | Admitting: Rheumatology

## 2023-06-08 DIAGNOSIS — G4719 Other hypersomnia: Secondary | ICD-10-CM

## 2023-06-08 DIAGNOSIS — J3089 Other allergic rhinitis: Secondary | ICD-10-CM

## 2023-06-08 DIAGNOSIS — R5383 Other fatigue: Secondary | ICD-10-CM

## 2023-06-08 DIAGNOSIS — R4189 Other symptoms and signs involving cognitive functions and awareness: Secondary | ICD-10-CM

## 2023-06-08 DIAGNOSIS — Z1401 Asymptomatic hemophilia A carrier: Secondary | ICD-10-CM

## 2023-06-08 DIAGNOSIS — R768 Other specified abnormal immunological findings in serum: Secondary | ICD-10-CM

## 2023-06-08 DIAGNOSIS — E559 Vitamin D deficiency, unspecified: Secondary | ICD-10-CM

## 2023-06-08 DIAGNOSIS — L659 Nonscarring hair loss, unspecified: Secondary | ICD-10-CM

## 2023-06-08 DIAGNOSIS — F32A Depression, unspecified: Secondary | ICD-10-CM

## 2023-06-08 DIAGNOSIS — M542 Cervicalgia: Secondary | ICD-10-CM

## 2023-06-08 DIAGNOSIS — G478 Other sleep disorders: Secondary | ICD-10-CM

## 2023-06-26 ENCOUNTER — Other Ambulatory Visit (HOSPITAL_BASED_OUTPATIENT_CLINIC_OR_DEPARTMENT_OTHER): Payer: Self-pay

## 2023-06-26 MED ORDER — ATOMOXETINE HCL 25 MG PO CAPS
50.0000 mg | ORAL_CAPSULE | Freq: Every day | ORAL | 1 refills | Status: DC
  Filled 2023-06-26: qty 60, 30d supply, fill #0
  Filled 2023-09-23: qty 60, 30d supply, fill #1

## 2023-06-29 ENCOUNTER — Ambulatory Visit: Payer: Managed Care, Other (non HMO) | Admitting: Rheumatology

## 2023-06-30 ENCOUNTER — Other Ambulatory Visit (HOSPITAL_BASED_OUTPATIENT_CLINIC_OR_DEPARTMENT_OTHER): Payer: Self-pay

## 2023-06-30 MED ORDER — ATOMOXETINE HCL 80 MG PO CAPS
80.0000 mg | ORAL_CAPSULE | Freq: Every day | ORAL | 1 refills | Status: DC
  Filled 2023-06-30: qty 30, 30d supply, fill #0
  Filled 2023-10-06: qty 30, 30d supply, fill #1

## 2023-07-04 ENCOUNTER — Other Ambulatory Visit: Payer: Self-pay

## 2023-07-04 ENCOUNTER — Other Ambulatory Visit (HOSPITAL_BASED_OUTPATIENT_CLINIC_OR_DEPARTMENT_OTHER): Payer: Self-pay

## 2023-07-04 MED ORDER — LISDEXAMFETAMINE DIMESYLATE 60 MG PO CHEW
60.0000 mg | CHEWABLE_TABLET | Freq: Every day | ORAL | 0 refills | Status: DC
  Filled 2023-07-04: qty 30, 30d supply, fill #0

## 2023-07-05 ENCOUNTER — Other Ambulatory Visit: Payer: Self-pay | Admitting: Family Medicine

## 2023-07-05 NOTE — Telephone Encounter (Signed)
LOV: 04/27/23 Last fill: 04/13/23, 90 tablets 0 refill

## 2023-07-13 ENCOUNTER — Other Ambulatory Visit (HOSPITAL_BASED_OUTPATIENT_CLINIC_OR_DEPARTMENT_OTHER): Payer: Self-pay

## 2023-07-13 MED FILL — Sertraline HCl Tab 100 MG: ORAL | 30 days supply | Qty: 30 | Fill #0 | Status: AC

## 2023-08-03 ENCOUNTER — Other Ambulatory Visit (HOSPITAL_BASED_OUTPATIENT_CLINIC_OR_DEPARTMENT_OTHER): Payer: Self-pay

## 2023-08-03 MED ORDER — LISDEXAMFETAMINE DIMESYLATE 60 MG PO CHEW
1.0000 | CHEWABLE_TABLET | Freq: Every day | ORAL | 0 refills | Status: DC
  Filled 2023-08-03: qty 30, 30d supply, fill #0

## 2023-08-07 ENCOUNTER — Other Ambulatory Visit (HOSPITAL_BASED_OUTPATIENT_CLINIC_OR_DEPARTMENT_OTHER): Payer: Self-pay

## 2023-08-07 MED ORDER — LISDEXAMFETAMINE DIMESYLATE 60 MG PO CHEW
60.0000 mg | CHEWABLE_TABLET | Freq: Every day | ORAL | 0 refills | Status: DC
  Filled 2024-01-03: qty 30, 30d supply, fill #0

## 2023-08-07 MED ORDER — LISDEXAMFETAMINE DIMESYLATE 60 MG PO CHEW
1.0000 | CHEWABLE_TABLET | Freq: Every day | ORAL | 0 refills | Status: DC
  Filled 2023-09-11: qty 30, 30d supply, fill #0

## 2023-08-07 MED ORDER — ATOMOXETINE HCL 80 MG PO CAPS
80.0000 mg | ORAL_CAPSULE | Freq: Every day | ORAL | 5 refills | Status: DC
  Filled 2023-08-07: qty 30, 30d supply, fill #0
  Filled 2023-09-11: qty 30, 30d supply, fill #1
  Filled 2023-11-06: qty 30, 30d supply, fill #2
  Filled 2023-12-04: qty 30, 30d supply, fill #3
  Filled 2023-12-30 (×3): qty 30, 30d supply, fill #4
  Filled 2024-01-29 – 2024-02-09 (×2): qty 30, 30d supply, fill #5

## 2023-08-07 MED ORDER — LISDEXAMFETAMINE DIMESYLATE 60 MG PO CHEW
60.0000 mg | CHEWABLE_TABLET | Freq: Every day | ORAL | 0 refills | Status: DC
  Filled 2023-10-05 – 2023-10-09 (×3): qty 30, 30d supply, fill #0

## 2023-08-14 ENCOUNTER — Other Ambulatory Visit (HOSPITAL_BASED_OUTPATIENT_CLINIC_OR_DEPARTMENT_OTHER): Payer: Self-pay

## 2023-08-14 MED FILL — Sertraline HCl Tab 100 MG: ORAL | 30 days supply | Qty: 30 | Fill #1 | Status: AC

## 2023-08-23 ENCOUNTER — Other Ambulatory Visit (HOSPITAL_BASED_OUTPATIENT_CLINIC_OR_DEPARTMENT_OTHER): Payer: Self-pay

## 2023-08-23 MED ORDER — METFORMIN HCL ER 500 MG PO TB24
500.0000 mg | ORAL_TABLET | Freq: Every evening | ORAL | 2 refills | Status: DC
  Filled 2023-08-23: qty 30, 30d supply, fill #0
  Filled 2023-09-22: qty 30, 30d supply, fill #1
  Filled 2023-10-16: qty 30, 30d supply, fill #2

## 2023-08-29 ENCOUNTER — Other Ambulatory Visit (HOSPITAL_BASED_OUTPATIENT_CLINIC_OR_DEPARTMENT_OTHER): Payer: Self-pay

## 2023-08-29 MED ORDER — PHENTERMINE HCL 37.5 MG PO TABS
18.7500 mg | ORAL_TABLET | Freq: Every day | ORAL | 0 refills | Status: DC
  Filled 2023-08-29: qty 30, 30d supply, fill #0

## 2023-09-11 ENCOUNTER — Other Ambulatory Visit: Payer: Self-pay

## 2023-09-11 ENCOUNTER — Other Ambulatory Visit (HOSPITAL_BASED_OUTPATIENT_CLINIC_OR_DEPARTMENT_OTHER): Payer: Self-pay

## 2023-09-11 MED FILL — Sertraline HCl Tab 100 MG: ORAL | 30 days supply | Qty: 30 | Fill #2 | Status: CN

## 2023-09-11 MED FILL — Sertraline HCl Tab 100 MG: ORAL | 30 days supply | Qty: 30 | Fill #2 | Status: AC

## 2023-09-22 ENCOUNTER — Other Ambulatory Visit (HOSPITAL_BASED_OUTPATIENT_CLINIC_OR_DEPARTMENT_OTHER): Payer: Self-pay

## 2023-09-27 ENCOUNTER — Other Ambulatory Visit: Payer: Self-pay

## 2023-10-06 ENCOUNTER — Other Ambulatory Visit: Payer: Self-pay | Admitting: Family Medicine

## 2023-10-06 ENCOUNTER — Encounter: Payer: Self-pay | Admitting: Family Medicine

## 2023-10-06 ENCOUNTER — Other Ambulatory Visit (HOSPITAL_BASED_OUTPATIENT_CLINIC_OR_DEPARTMENT_OTHER): Payer: Self-pay

## 2023-10-06 ENCOUNTER — Other Ambulatory Visit: Payer: Self-pay

## 2023-10-06 MED ORDER — SERTRALINE HCL 100 MG PO TABS
100.0000 mg | ORAL_TABLET | Freq: Every day | ORAL | 0 refills | Status: DC
  Filled 2023-10-06: qty 90, 90d supply, fill #0

## 2023-10-09 ENCOUNTER — Other Ambulatory Visit: Payer: Self-pay

## 2023-10-09 ENCOUNTER — Other Ambulatory Visit (HOSPITAL_BASED_OUTPATIENT_CLINIC_OR_DEPARTMENT_OTHER): Payer: Self-pay

## 2023-10-30 ENCOUNTER — Other Ambulatory Visit (HOSPITAL_BASED_OUTPATIENT_CLINIC_OR_DEPARTMENT_OTHER): Payer: Self-pay

## 2023-11-06 ENCOUNTER — Other Ambulatory Visit: Payer: Self-pay

## 2023-11-06 ENCOUNTER — Telehealth: Payer: Managed Care, Other (non HMO) | Admitting: Family Medicine

## 2023-11-06 ENCOUNTER — Other Ambulatory Visit (HOSPITAL_BASED_OUTPATIENT_CLINIC_OR_DEPARTMENT_OTHER): Payer: Self-pay

## 2023-11-06 DIAGNOSIS — H6693 Otitis media, unspecified, bilateral: Secondary | ICD-10-CM | POA: Diagnosis not present

## 2023-11-06 MED ORDER — LISDEXAMFETAMINE DIMESYLATE 60 MG PO CHEW
60.0000 mg | CHEWABLE_TABLET | Freq: Every day | ORAL | 0 refills | Status: DC
  Filled 2023-11-06: qty 30, 30d supply, fill #0

## 2023-11-06 MED ORDER — CIPROFLOXACIN-DEXAMETHASONE 0.3-0.1 % OT SUSP
4.0000 [drp] | Freq: Two times a day (BID) | OTIC | 0 refills | Status: DC
  Filled 2023-11-06: qty 7.5, 19d supply, fill #0

## 2023-11-06 MED ORDER — LISDEXAMFETAMINE DIMESYLATE 60 MG PO CHEW
60.0000 mg | CHEWABLE_TABLET | Freq: Every day | ORAL | 0 refills | Status: DC
  Filled 2023-12-06: qty 30, 30d supply, fill #0

## 2023-11-06 MED ORDER — LISDEXAMFETAMINE DIMESYLATE 60 MG PO CHEW
60.0000 mg | CHEWABLE_TABLET | Freq: Every day | ORAL | 0 refills | Status: DC
  Filled 2024-02-03 (×3): qty 30, 30d supply, fill #0

## 2023-11-06 NOTE — Progress Notes (Signed)
 E-Visit for Ear Pain - Acute Otitis Media   We are sorry that you are not feeling well. Here is how we plan to help!  Based on what you have shared with me it looks like you have Acute Otitis Media.  Acute Otitis Media is an infection of the middle or "inner" ear. This type of infection can cause redness, inflammation, and fluid buildup behind the tympanic membrane (ear drum).  The usual symptoms include: Earache/Pain Fever Upper respiratory symptoms Lack of energy/Fatigue/Malaise Slight hearing loss gradually worsening- if the inner ear fills with fluid What causes middle ear infections? Most middle ear infections occur when an infection such as a cold, leads to a build-up of mucus in the middle ear and causes the Eustachian tube (a thin tube that runs from the middle ear to the back of the nose) to become swollen or blocked.   This means mucus can't drain away properly, making it easier for an infection to spread into the middle ear.  How middle ear infections are treated: Most ear infections clear up within three to five days and don't need any specific treatment. If necessary, tylenol or ibuprofen should be used to relieve pain and a high temperature.  If you develop a fever higher than 102, or any significantly worsening symptoms, this could indicate a more serious infection moving to the middle/inner and needs face to face evaluation in an office by a provider.   Antibiotics aren't routinely used to treat middle ear infections, although they may occasionally be prescribed if symptoms persist or are particularly severe. Given your presentation,   I have prescribed: Ciprofloxin 0.2% and hydrocortisone 1% otic suspension 3 drops in affected ears twice daily for 7 days   Your symptoms should improve over the next 3 days and should resolve in about 7 days. Be sure to complete ALL of the prescription(s) given.  HOME CARE: Wash your hands frequently. If you are prescribed an ear drop, do  not place the tip of the bottle on your ear or touch it with your fingers. You can take Acetaminophen 650 mg every 4-6 hours as needed for pain.  If pain is severe or moderate, you can apply a heating pad (set on low) or hot water bottle (wrapped in a towel) to outer ear for 20 minutes.  This will also increase drainage.  GET HELP RIGHT AWAY IF: Fever is over 102.2 degrees. You develop progressive ear pain or hearing loss. Ear symptoms persist longer than 3 days after treatment.  MAKE SURE YOU: Understand these instructions. Will watch your condition. Will get help right away if you are not doing well or get worse.  Thank you for choosing an e-visit.  Your e-visit answers were reviewed by a board certified advanced clinical practitioner to complete your personal care plan. Depending upon the condition, your plan could have included both over the counter or prescription medications.  Please review your pharmacy choice. Make sure the pharmacy is open so you can pick up the prescription now. If there is a problem, you may contact your provider through [employer] and have the prescription routed to another pharmacy.  Your safety is important to [place]. If you have drug allergies check your prescription carefully.   For the next 24 hours you can use MyChart to ask questions about today's visit, request a non-urgent call back, or ask for a work or school excuse. You will get an email with a survey after your eVisit asking about your experience.  We would appreciate your feedback. I hope that your e-visit has been valuable and will aid in your recovery.  I provided 5 minutes of non face-to-face time during this encounter for chart review, medication and order placement, as well as and documentation.

## 2023-11-07 ENCOUNTER — Other Ambulatory Visit (HOSPITAL_BASED_OUTPATIENT_CLINIC_OR_DEPARTMENT_OTHER): Payer: Self-pay

## 2023-11-17 ENCOUNTER — Other Ambulatory Visit (HOSPITAL_BASED_OUTPATIENT_CLINIC_OR_DEPARTMENT_OTHER): Payer: Self-pay

## 2023-11-19 ENCOUNTER — Other Ambulatory Visit (HOSPITAL_BASED_OUTPATIENT_CLINIC_OR_DEPARTMENT_OTHER): Payer: Self-pay

## 2023-11-19 MED ORDER — METFORMIN HCL ER 500 MG PO TB24
500.0000 mg | ORAL_TABLET | Freq: Every evening | ORAL | 2 refills | Status: DC
  Filled 2023-11-19: qty 30, 30d supply, fill #0
  Filled 2023-12-18 – 2023-12-29 (×2): qty 30, 30d supply, fill #1
  Filled 2024-01-01: qty 30, 30d supply, fill #2

## 2023-11-21 ENCOUNTER — Other Ambulatory Visit (HOSPITAL_BASED_OUTPATIENT_CLINIC_OR_DEPARTMENT_OTHER): Payer: Self-pay

## 2023-12-04 ENCOUNTER — Other Ambulatory Visit (HOSPITAL_BASED_OUTPATIENT_CLINIC_OR_DEPARTMENT_OTHER): Payer: Self-pay

## 2023-12-06 ENCOUNTER — Other Ambulatory Visit (HOSPITAL_BASED_OUTPATIENT_CLINIC_OR_DEPARTMENT_OTHER): Payer: Self-pay

## 2023-12-18 ENCOUNTER — Other Ambulatory Visit (HOSPITAL_BASED_OUTPATIENT_CLINIC_OR_DEPARTMENT_OTHER): Payer: Self-pay

## 2023-12-27 ENCOUNTER — Other Ambulatory Visit: Payer: Self-pay

## 2023-12-27 ENCOUNTER — Other Ambulatory Visit (HOSPITAL_BASED_OUTPATIENT_CLINIC_OR_DEPARTMENT_OTHER): Payer: Self-pay

## 2023-12-28 ENCOUNTER — Other Ambulatory Visit (HOSPITAL_BASED_OUTPATIENT_CLINIC_OR_DEPARTMENT_OTHER): Payer: Self-pay

## 2023-12-30 ENCOUNTER — Other Ambulatory Visit (HOSPITAL_BASED_OUTPATIENT_CLINIC_OR_DEPARTMENT_OTHER): Payer: Self-pay

## 2024-01-01 ENCOUNTER — Other Ambulatory Visit (HOSPITAL_BASED_OUTPATIENT_CLINIC_OR_DEPARTMENT_OTHER): Payer: Self-pay

## 2024-01-03 ENCOUNTER — Other Ambulatory Visit (HOSPITAL_BASED_OUTPATIENT_CLINIC_OR_DEPARTMENT_OTHER): Payer: Self-pay

## 2024-01-04 ENCOUNTER — Other Ambulatory Visit: Payer: Self-pay | Admitting: Family Medicine

## 2024-01-04 NOTE — Telephone Encounter (Signed)
 LOV: 04/27/23 Last fill: 10/06/23, 90 tablets 0 refills

## 2024-01-08 ENCOUNTER — Other Ambulatory Visit (HOSPITAL_BASED_OUTPATIENT_CLINIC_OR_DEPARTMENT_OTHER): Payer: Self-pay

## 2024-01-08 MED ORDER — SERTRALINE HCL 100 MG PO TABS
100.0000 mg | ORAL_TABLET | Freq: Every day | ORAL | 0 refills | Status: DC
  Filled 2024-01-08: qty 30, 30d supply, fill #0

## 2024-02-03 ENCOUNTER — Other Ambulatory Visit (HOSPITAL_COMMUNITY): Payer: Self-pay

## 2024-02-03 ENCOUNTER — Other Ambulatory Visit (HOSPITAL_BASED_OUTPATIENT_CLINIC_OR_DEPARTMENT_OTHER): Payer: Self-pay

## 2024-02-03 ENCOUNTER — Other Ambulatory Visit: Payer: Self-pay | Admitting: Family Medicine

## 2024-02-05 ENCOUNTER — Other Ambulatory Visit (HOSPITAL_COMMUNITY): Payer: Self-pay

## 2024-02-06 ENCOUNTER — Other Ambulatory Visit (HOSPITAL_BASED_OUTPATIENT_CLINIC_OR_DEPARTMENT_OTHER): Payer: Self-pay

## 2024-02-06 ENCOUNTER — Encounter (HOSPITAL_BASED_OUTPATIENT_CLINIC_OR_DEPARTMENT_OTHER): Payer: Self-pay

## 2024-02-08 ENCOUNTER — Encounter: Payer: Self-pay | Admitting: Family Medicine

## 2024-02-08 ENCOUNTER — Ambulatory Visit (INDEPENDENT_AMBULATORY_CARE_PROVIDER_SITE_OTHER): Admitting: Family Medicine

## 2024-02-08 ENCOUNTER — Other Ambulatory Visit (HOSPITAL_BASED_OUTPATIENT_CLINIC_OR_DEPARTMENT_OTHER): Payer: Self-pay

## 2024-02-08 VITALS — BP 126/84 | HR 102 | Temp 97.6°F | Ht 62.0 in | Wt 139.0 lb

## 2024-02-08 DIAGNOSIS — F32A Depression, unspecified: Secondary | ICD-10-CM | POA: Diagnosis not present

## 2024-02-08 DIAGNOSIS — E559 Vitamin D deficiency, unspecified: Secondary | ICD-10-CM

## 2024-02-08 DIAGNOSIS — F909 Attention-deficit hyperactivity disorder, unspecified type: Secondary | ICD-10-CM | POA: Diagnosis not present

## 2024-02-08 DIAGNOSIS — R7303 Prediabetes: Secondary | ICD-10-CM

## 2024-02-08 MED ORDER — SERTRALINE HCL 50 MG PO TABS
50.0000 mg | ORAL_TABLET | Freq: Every day | ORAL | 1 refills | Status: DC
  Filled 2024-02-08: qty 30, 30d supply, fill #0

## 2024-02-08 MED ORDER — METFORMIN HCL ER 500 MG PO TB24
500.0000 mg | ORAL_TABLET | Freq: Every evening | ORAL | 2 refills | Status: DC
  Filled 2024-02-08: qty 30, 30d supply, fill #0
  Filled 2024-03-13: qty 30, 30d supply, fill #1
  Filled 2024-04-15: qty 30, 30d supply, fill #2

## 2024-02-08 NOTE — Telephone Encounter (Signed)
 fyi

## 2024-02-08 NOTE — Patient Instructions (Signed)
 Stop taking sertraline  100 mg and start taking sertraline  50 mg daily.   Follow up with me in 6-8 weeks.   We will be in touch once I have your records from [facility].

## 2024-02-08 NOTE — Telephone Encounter (Signed)
 Lab results

## 2024-02-08 NOTE — Progress Notes (Signed)
 Subjective:     Patient ID: Dominique Powers, female    DOB: [birth year], 43 y.o.   MRN: [id number]  Chief Complaint  Patient presents with   Medical Management of Chronic Issues    Refill for zoloft   Prediabetic    HPI  History of Present Illness          Here to follow up on chronic health conditions.   States depression is well controlled. She would like to wean off of sertraline . She has been taking 100 mg. Plans to start seeing a therapist.   She is seeing Dr. Lugene Sahara at [facility] and had labs there 2 months   She is exercising and eating a healthier diet.   A1c 5.7% and started Zepbound since October 2024 Taking metformin  500 mg XR at bedtime.   She has lost a lot of weight, approx 40 +lbs.   She has been going to [facility]. States they no longer take her insurance.  Reports having adult ADHD diagnosis.  Takes Strattera  80 mg at bedtime and Vyvanse  30 mg chew in the morning and afternoon.     Health Maintenance Due  Topic Date Due   Hepatitis C Screening  Never done   COVID-19 Vaccine (1 - 2024-25 season) Never done    Past Medical History:  Diagnosis Date   ADD (attention deficit disorder)    Alcohol use complicating pregnancy in first trimester 10/28/2015   Formatting of this note might be different from the original. 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    Hypertension    Postpartum care following cesarean delivery (6/22) 04/07/2016    Past Surgical History:  Procedure Laterality Date   BREAST BIOPSY Right 01/23/2023   US  RT BREAST BX W LOC DEV 1ST LESION IMG BX SPEC US  GUIDE 01/23/2023 [facility] MAMMOGRAPHY   CESAREAN SECTION N/A 04/07/2016   Procedure: CESAREAN SECTION;  Surgeon: Terri Fester, MD;  Location: [facility];  Service: Obstetrics;   Laterality: N/A;   MOLE REMOVAL     WISDOM TOOTH EXTRACTION      Family History  Problem Relation Age of Onset   Hyperlipidemia Mother    Other Mother        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   Hypertension Mother    Depression Mother    ADD / ADHD Mother    Alcohol abuse Mother    Hemophilia Cousin        maternal female cousin   Healthy Father    ADD / ADHD Sister    Learning disabilities Sister    Hemophilia Cousin        maternal female cousin   Diabetes Maternal Grandmother    Diabetes Paternal Grandfather    Stroke Sister     Social History   Socioeconomic History   Marital status: Married    Spouse name: Not on file   Number of children: 1   Years of education: college   Highest education level: Master's degree (e.g., MA, MS, MEng, MEd, MSW, MBA)  Occupational History   Occupation: Teacher  Tobacco Use   Smoking status: Never   Smokeless tobacco: Never  Substance and Sexual Activity   Alcohol use: Yes    Alcohol/week: 5.0 standard drinks of alcohol    Types:  5 Glasses of wine per week    Comment: no use since 10/2019   Drug use: Never   Sexual activity: Yes    Birth control/protection: I.U.D.  Other Topics Concern   Not on file  Social History Narrative   Lives at home with husband and daughter.   Right-handed.   2 cups caffeine  per day (tea).   Social Drivers of Corporate investment banker Strain: Low Risk  (02/08/2024)   Overall Financial Resource Strain (CARDIA)    Difficulty of Paying Living Expenses: Not very hard  Food Insecurity: No Food Insecurity (02/08/2024)   Hunger Vital Sign    Worried About Running Out of Food in the Last Year: Never true    Ran Out of Food in the Last Year: Never true  Transportation Needs: No Transportation Needs (02/08/2024)   PRAPARE - Administrator, Civil Service (Medical): No    Lack of Transportation (Non-Medical): No  Physical Activity: Insufficiently Active (02/08/2024)   Exercise Vital Sign    Days of Exercise  per Week: 4 days    Minutes of Exercise per Session: 30 min  Stress: No Stress Concern Present (02/08/2024)   [employer] of Occupational Health - Occupational Stress Questionnaire    Feeling of Stress : Only a little  Social Connections: Moderately Integrated (02/08/2024)   Social Connection and Isolation Panel [NHANES]    Frequency of Communication with Friends and Family: Twice a week    Frequency of Social Gatherings with Friends and Family: More than three times a week    Attends Religious Services: Never    Database administrator or Organizations: Yes    Attends Banker Meetings: 1 to 4 times per year    Marital Status: Married  Catering manager Violence: Not on file    Outpatient Medications Prior to Visit  Medication Sig Dispense Refill   atomoxetine  (STRATTERA ) 80 MG capsule Take 1 capsule (80 mg total) by mouth daily. 30 capsule 5   Cetirizine HCl (ZYRTEC ALLERGY) 10 MG TBDP      Lisdexamfetamine Dimesylate  (VYVANSE ) 60 MG CHEW Chew by mouth.     Lisdexamfetamine Dimesylate  60 MG CHEW Chew 1 tablet (60 mg total) by mouth daily. 30 tablet 0   metFORMIN  (GLUCOPHAGE -XR) 500 MG 24 hr tablet Take 1 tablet (500 mg total) by mouth every evening with meal. 30 tablet 2   tirzepatide (ZEPBOUND) 5 MG/0.5ML Pen Inject 5 mg into the skin once a week.     VITAMIN D  PO Take by mouth.     Lisdexamfetamine Dimesylate  60 MG CHEW Chew 1 tablet (60 mg total) by mouth daily. 30 tablet 0   Lisdexamfetamine Dimesylate  60 MG CHEW Chew 1 tablet (60 mg total) by mouth daily. 30 tablet 0   Lisdexamfetamine Dimesylate  60 MG CHEW Chew 1 tablet (60 mg total) by mouth daily. 30 tablet 0   sertraline  (ZOLOFT ) 100 MG tablet Take 1 tablet (100 mg total) by mouth daily. NEEDS OFFICE VISIT FOR REFILLS. 30 tablet 0   atomoxetine  (STRATTERA ) 80 MG capsule 1 capsule by mouth daily 30 capsule 1   ciprofloxacin -dexamethasone  (CIPRODEX ) OTIC suspension Place 4 drops into both ears 2 (two) times daily.  7.5 mL 0   Lisdexamfetamine Dimesylate  60 MG CHEW Chew 1 tablet (60 mg total) by mouth daily. 30 tablet 0   Lisdexamfetamine Dimesylate  60 MG CHEW Chew 1 tablet (60 mg total) by mouth daily. 30 tablet 0   nystatin -triamcinolone  ointment (MYCOLOG) Apply  1 Application topically 2 (two) times daily. 60 g 0   phentermine  (ADIPEX-P ) 37.5 MG tablet Take 0.5-1 tablets (18.75-37.5 mg total) by mouth daily as directed. 30 tablet 0   Vitamin D , Ergocalciferol , (DRISDOL ) 1.25 MG (50000 UNIT) CAPS capsule Take 1 capsule (50,000 Units total) by mouth every 7 (seven) days. 4 capsule 2   No facility-administered medications prior to visit.    Allergies  Allergen Reactions   Amoxicillin Anaphylaxis    Has patient had a PCN reaction causing immediate rash, facial/tongue/throat swelling, SOB or lightheadedness with hypotension: Yes Has patient had a PCN reaction causing severe rash involving mucus membranes or skin necrosis: No Has patient had a PCN reaction that required hospitalization Yes Has patient had a PCN reaction occurring within the last 10 years: No If all of the above answers are "NO", then may proceed with Cephalosporin use.     Review of Systems  Constitutional:  Positive for weight loss. Negative for chills, fever and malaise/fatigue.       Intentional   Respiratory:  Negative for shortness of breath.   Cardiovascular:  Negative for chest pain, palpitations and leg swelling.  Gastrointestinal:  Negative for abdominal pain, constipation, diarrhea, nausea and vomiting.  Genitourinary:  Negative for dysuria, frequency and urgency.  Neurological:  Negative for dizziness, focal weakness and headaches.  Psychiatric/Behavioral:  Negative for depression. The patient is not nervous/anxious.        Objective:    Physical Exam Constitutional:      General: She is not in acute distress.    Appearance: She is not ill-appearing.  Eyes:     Extraocular Movements: Extraocular movements intact.      Conjunctiva/sclera: Conjunctivae normal.  Cardiovascular:     Rate and Rhythm: Normal rate.  Pulmonary:     Effort: Pulmonary effort is normal.  Musculoskeletal:     Cervical back: Normal range of motion and neck supple.  Skin:    General: Skin is warm and dry.  Neurological:     General: No focal deficit present.     Mental Status: She is alert and oriented to person, place, and time.  Psychiatric:        Mood and Affect: Mood normal.        Behavior: Behavior normal.        Thought Content: Thought content normal.      BP 126/84 (BP Location: Left Arm, Patient Position: Sitting)   Pulse (!) 102   Temp 97.6 F (36.4 C) (Temporal)   Ht 5\' 2"  (1.575 m)   Wt 139 lb (63 kg)   SpO2 98%   Breastfeeding No   BMI 25.42 kg/m  Wt Readings from Last 3 Encounters:  02/08/24 139 lb (63 kg)  05/09/23 180 lb (81.6 kg)  04/27/23 176 lb (79.8 kg)       Assessment & Plan:   Problem List Items Addressed This Visit     Depression - Primary   Relevant Medications   sertraline  (ZOLOFT ) 50 MG tablet   Vitamin D  deficiency   Other Visit Diagnoses       Attention deficit hyperactivity disorder (ADHD), unspecified ADHD type         Prediabetes          He is here to follow-up on depression.  Her depression is well-managed.  She would like to start weaning off of sertraline .  Will reduce her dose from 100 mg to 50 mg today. [facility]  States she has been getting labs done there and has been taking metformin  and Zepbound since finding out she has prediabetes.  She has lost a significant amount of weight, 40+ pounds.  Reports feeling more energetic and healthier.  I am requesting records from [facility] including her lab results. She has been going to [facility].  States they no longer take her insurance and she would like for me to take over her medications.  I will request records from [facility] and then follow-up with her. Recommend follow-up for sertraline  and depression and 8 weeks.  I have discontinued Marykathleen Cislo's Vitamin D  (Ergocalciferol ), nystatin -triamcinolone  ointment, phentermine , ciprofloxacin -dexamethasone , and sertraline . I am also having her start on sertraline . Additionally, I am having her maintain her ZyrTEC Allergy, Lisdexamfetamine Dimesylate , atomoxetine , Lisdexamfetamine Dimesylate , metFORMIN , VITAMIN D  PO, and Zepbound.  Meds ordered this encounter  Medications   sertraline  (ZOLOFT ) 50 MG tablet    Sig: Take 1 tablet (50 mg total) by mouth daily.    Dispense:  30 tablet    Refill:  1    Supervising Provider:   Bambi Lever A [[id number]]

## 2024-02-12 DIAGNOSIS — F5104 Psychophysiologic insomnia: Secondary | ICD-10-CM | POA: Diagnosis not present

## 2024-02-12 DIAGNOSIS — F988 Other specified behavioral and emotional disorders with onset usually occurring in childhood and adolescence: Secondary | ICD-10-CM | POA: Diagnosis not present

## 2024-02-12 DIAGNOSIS — E559 Vitamin D deficiency, unspecified: Secondary | ICD-10-CM | POA: Diagnosis not present

## 2024-02-12 DIAGNOSIS — R7303 Prediabetes: Secondary | ICD-10-CM | POA: Diagnosis not present

## 2024-02-12 DIAGNOSIS — Z6824 Body mass index (BMI) 24.0-24.9, adult: Secondary | ICD-10-CM | POA: Diagnosis not present

## 2024-02-12 DIAGNOSIS — K5903 Drug induced constipation: Secondary | ICD-10-CM | POA: Diagnosis not present

## 2024-02-12 DIAGNOSIS — Z8639 Personal history of other endocrine, nutritional and metabolic disease: Secondary | ICD-10-CM | POA: Diagnosis not present

## 2024-02-27 ENCOUNTER — Other Ambulatory Visit (HOSPITAL_BASED_OUTPATIENT_CLINIC_OR_DEPARTMENT_OTHER): Payer: Self-pay

## 2024-02-27 ENCOUNTER — Encounter: Payer: Self-pay | Admitting: Family Medicine

## 2024-02-27 MED ORDER — SERTRALINE HCL 100 MG PO TABS
100.0000 mg | ORAL_TABLET | Freq: Every day | ORAL | 0 refills | Status: DC
  Filled 2024-02-27: qty 30, 30d supply, fill #0
  Filled 2024-03-21 – 2024-03-22 (×2): qty 30, 30d supply, fill #1
  Filled 2024-04-20: qty 30, 30d supply, fill #2

## 2024-02-29 ENCOUNTER — Other Ambulatory Visit: Payer: Self-pay | Admitting: Family Medicine

## 2024-02-29 DIAGNOSIS — Z1231 Encounter for screening mammogram for malignant neoplasm of breast: Secondary | ICD-10-CM

## 2024-03-04 ENCOUNTER — Encounter (HOSPITAL_BASED_OUTPATIENT_CLINIC_OR_DEPARTMENT_OTHER): Payer: Self-pay | Admitting: Radiology

## 2024-03-04 ENCOUNTER — Other Ambulatory Visit (HOSPITAL_BASED_OUTPATIENT_CLINIC_OR_DEPARTMENT_OTHER): Payer: Self-pay

## 2024-03-04 ENCOUNTER — Ambulatory Visit (HOSPITAL_BASED_OUTPATIENT_CLINIC_OR_DEPARTMENT_OTHER)
Admission: RE | Admit: 2024-03-04 | Discharge: 2024-03-04 | Disposition: A | Discharge status: Home / Self Care | Source: Ambulatory Visit | Attending: Family Medicine | Admitting: Family Medicine

## 2024-03-04 DIAGNOSIS — Z1231 Encounter for screening mammogram for malignant neoplasm of breast: Secondary | ICD-10-CM | POA: Diagnosis not present

## 2024-03-04 DIAGNOSIS — F902 Attention-deficit hyperactivity disorder, combined type: Secondary | ICD-10-CM | POA: Diagnosis not present

## 2024-03-04 DIAGNOSIS — Z79899 Other long term (current) drug therapy: Secondary | ICD-10-CM | POA: Diagnosis not present

## 2024-03-04 MED ORDER — LISDEXAMFETAMINE DIMESYLATE 60 MG PO CHEW
60.0000 mg | CHEWABLE_TABLET | Freq: Every day | ORAL | 0 refills | Status: AC
  Filled 2024-03-04: qty 30, 30d supply, fill #0

## 2024-03-04 MED ORDER — LISDEXAMFETAMINE DIMESYLATE 60 MG PO CHEW
60.0000 mg | CHEWABLE_TABLET | Freq: Every day | ORAL | 0 refills | Status: DC
  Filled 2024-05-04 (×2): qty 30, 30d supply, fill #0

## 2024-03-04 MED ORDER — LISDEXAMFETAMINE DIMESYLATE 60 MG PO CHEW
60.0000 mg | CHEWABLE_TABLET | Freq: Every day | ORAL | 0 refills | Status: AC
Start: 2024-03-04 — End: ?
  Filled 2024-04-04: qty 30, 30d supply, fill #0

## 2024-03-04 MED ORDER — ATOMOXETINE HCL 80 MG PO CAPS
80.0000 mg | ORAL_CAPSULE | Freq: Every day | ORAL | 5 refills | Status: AC
  Filled 2024-03-04 – 2024-03-05 (×2): qty 30, 30d supply, fill #0
  Filled 2024-04-04 – 2024-04-16 (×2): qty 30, 30d supply, fill #1
  Filled 2024-05-11: qty 30, 30d supply, fill #2
  Filled 2024-06-10: qty 30, 30d supply, fill #3
  Filled 2024-07-10: qty 30, 30d supply, fill #4
  Filled 2024-08-07: qty 30, 30d supply, fill #5

## 2024-03-05 ENCOUNTER — Other Ambulatory Visit (HOSPITAL_BASED_OUTPATIENT_CLINIC_OR_DEPARTMENT_OTHER): Payer: Self-pay

## 2024-03-05 ENCOUNTER — Other Ambulatory Visit: Payer: Self-pay

## 2024-03-13 ENCOUNTER — Other Ambulatory Visit (HOSPITAL_BASED_OUTPATIENT_CLINIC_OR_DEPARTMENT_OTHER): Payer: Self-pay

## 2024-03-14 DIAGNOSIS — D3132 Benign neoplasm of left choroid: Secondary | ICD-10-CM | POA: Diagnosis not present

## 2024-03-14 DIAGNOSIS — E119 Type 2 diabetes mellitus without complications: Secondary | ICD-10-CM | POA: Diagnosis not present

## 2024-03-21 ENCOUNTER — Other Ambulatory Visit (HOSPITAL_BASED_OUTPATIENT_CLINIC_OR_DEPARTMENT_OTHER): Payer: Self-pay

## 2024-03-21 DIAGNOSIS — F988 Other specified behavioral and emotional disorders with onset usually occurring in childhood and adolescence: Secondary | ICD-10-CM | POA: Diagnosis not present

## 2024-03-21 DIAGNOSIS — Z6823 Body mass index (BMI) 23.0-23.9, adult: Secondary | ICD-10-CM | POA: Diagnosis not present

## 2024-03-21 DIAGNOSIS — R7303 Prediabetes: Secondary | ICD-10-CM | POA: Diagnosis not present

## 2024-03-21 DIAGNOSIS — K5903 Drug induced constipation: Secondary | ICD-10-CM | POA: Diagnosis not present

## 2024-03-21 DIAGNOSIS — Z8639 Personal history of other endocrine, nutritional and metabolic disease: Secondary | ICD-10-CM | POA: Diagnosis not present

## 2024-03-21 DIAGNOSIS — E559 Vitamin D deficiency, unspecified: Secondary | ICD-10-CM | POA: Diagnosis not present

## 2024-03-22 ENCOUNTER — Other Ambulatory Visit (HOSPITAL_BASED_OUTPATIENT_CLINIC_OR_DEPARTMENT_OTHER): Payer: Self-pay

## 2024-04-04 ENCOUNTER — Other Ambulatory Visit (HOSPITAL_BASED_OUTPATIENT_CLINIC_OR_DEPARTMENT_OTHER): Payer: Self-pay

## 2024-04-15 ENCOUNTER — Other Ambulatory Visit (HOSPITAL_BASED_OUTPATIENT_CLINIC_OR_DEPARTMENT_OTHER): Payer: Self-pay

## 2024-04-16 ENCOUNTER — Ambulatory Visit: Admitting: Family Medicine

## 2024-04-18 ENCOUNTER — Other Ambulatory Visit (HOSPITAL_BASED_OUTPATIENT_CLINIC_OR_DEPARTMENT_OTHER): Payer: Self-pay

## 2024-04-27 ENCOUNTER — Other Ambulatory Visit (HOSPITAL_BASED_OUTPATIENT_CLINIC_OR_DEPARTMENT_OTHER): Payer: Self-pay

## 2024-04-28 ENCOUNTER — Telehealth: Admitting: Family

## 2024-04-28 DIAGNOSIS — F411 Generalized anxiety disorder: Secondary | ICD-10-CM

## 2024-04-28 DIAGNOSIS — F320 Major depressive disorder, single episode, mild: Secondary | ICD-10-CM | POA: Diagnosis not present

## 2024-04-28 MED ORDER — SERTRALINE HCL 100 MG PO TABS
100.0000 mg | ORAL_TABLET | Freq: Every day | ORAL | 0 refills | Status: DC
Start: 2024-04-28 — End: 2024-07-25

## 2024-04-28 NOTE — Progress Notes (Signed)
 Virtual Visit Consent   Dominique Powers, you are scheduled for a virtual visit with a [facility] provider today. Just as with appointments in the office, your consent must be obtained to participate. Your consent will be active for this visit and any virtual visit you may have with one of our providers in the next 365 days. If you have a MyChart account, a copy of this consent can be sent to you electronically.  As this is a virtual visit, video technology does not allow for your provider to perform a traditional examination. This may limit your provider's ability to fully assess your condition. If your provider identifies any concerns that need to be evaluated in person or the need to arrange testing (such as labs, EKG, etc.), we will make arrangements to do so. Although advances in technology are sophisticated, we cannot ensure that it will always work on either your end or our end. If the connection with a video visit is poor, the visit may have to be switched to a telephone visit. With either a video or telephone visit, we are not always able to ensure that we have a secure connection.  By engaging in this virtual visit, you consent to the provision of healthcare and authorize for your insurance to be billed (if applicable) for the services provided during this visit. Depending on your insurance coverage, you may receive a charge related to this service.  I need to obtain your verbal consent now. Are you willing to proceed with your visit today? Dominique Powers has provided verbal consent on 04/28/2024 for a virtual visit (video or telephone). Bari Learn, FNP  Date: 04/28/2024 12:13 PM   Virtual Visit via Video Note   I, Bari Learn, connected with  Dominique Powers  ([id number], 08/21/1981) on 04/28/24 at 12:15 PM EDT by a video-enabled telemedicine application and verified that I am speaking with the correct person using two identifiers.  Location: Patient: Virtual Visit Location Patient:  Other: car Provider: Virtual Visit Location Provider: Home Office   I discussed the limitations of evaluation and management by telemedicine and the availability of in person appointments. The patient expressed understanding and agreed to proceed.    History of Present Illness: Dominique Powers is a 43 y.o. who identifies as a female who was assigned female at birth, and is being seen today for a refill of zoloft . She reports she is out of town at her parents home and does not have her zoloft .   HPI: Anxiety Presents for follow-up visit. Symptoms include depressed mood, nervous/anxious behavior and obsessions. The severity of symptoms is mild.    Depression        This is a chronic problem.  Associated symptoms include no helplessness, no hopelessness and not sad.  Past treatments include SSRIs - Selective serotonin reuptake inhibitors.  Past medical history includes anxiety.     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    Medications:  Current Outpatient Medications:    atomoxetine   (STRATTERA ) 80 MG capsule, Take 1 capsule (80 mg total) by mouth daily., Disp: 30 capsule, Rfl: 5   Cetirizine HCl (ZYRTEC ALLERGY) 10 MG TBDP, , Disp: , Rfl:    Lisdexamfetamine Dimesylate  (VYVANSE ) 60 MG CHEW, Chew by mouth., Disp: , Rfl:    Lisdexamfetamine Dimesylate  60 MG CHEW, Chew 1 tablet (60 mg total) by mouth daily., Disp: 30 tablet, Rfl: 0   Lisdexamfetamine Dimesylate  60 MG CHEW, Chew 1 tablet (60 mg total) by mouth daily. (60 days), Disp: 30 tablet, Rfl: 0   Lisdexamfetamine Dimesylate  60 MG CHEW, Chew 1 tablet (60 mg total) by mouth daily. (30 days), Disp: 30 tablet, Rfl: 0   metFORMIN  (GLUCOPHAGE -XR) 500 MG 24 hr tablet, Take 1 tablet (500 mg total) by mouth every evening with a meal., Disp: 30 tablet, Rfl: 2   sertraline  (ZOLOFT ) 100 MG tablet, Take 1 tablet (100 mg total) by mouth daily., Disp: 90 tablet, Rfl: 0   tirzepatide (ZEPBOUND) 5 MG/0.5ML Pen, Inject 5 mg into the skin once a week., Disp: , Rfl:    VITAMIN D  PO, Take by mouth., Disp: , Rfl:   Observations/Objective: Patient is well-developed, well-nourished in no acute distress.  Resting comfortably  Head is normocephalic, atraumatic.  No labored breathing.  Speech is clear and coherent with logical content.  Patient is alert and oriented at baseline.    Assessment and Plan: 1. GAD (generalized anxiety disorder) (Primary) - sertraline  (ZOLOFT ) 100 MG tablet; Take 1 tablet (100 mg total) by mouth daily.  Dispense: 90 tablet; Refill: 0  2. Depression, major, single episode, mild (HCC) - sertraline  (ZOLOFT ) 100 MG tablet; Take 1 tablet (100 mg total) by mouth daily.  Dispense: 90 tablet; Refill: 0  Will refill zoloft   Continue medications  Keep follow up with your PCP   Follow Up Instructions: I discussed the assessment and treatment plan with the patient. The patient was provided an opportunity to ask questions and all were answered. The patient agreed with the plan and demonstrated an understanding of the  instructions.  A copy of instructions were sent to the patient via MyChart unless otherwise noted below.     The patient was advised to call back or seek an in-person evaluation if the symptoms worsen or if the condition fails to improve as anticipated.    Bari Learn, FNP

## 2024-04-30 DIAGNOSIS — Z6823 Body mass index (BMI) 23.0-23.9, adult: Secondary | ICD-10-CM | POA: Diagnosis not present

## 2024-04-30 DIAGNOSIS — Z8639 Personal history of other endocrine, nutritional and metabolic disease: Secondary | ICD-10-CM | POA: Diagnosis not present

## 2024-04-30 DIAGNOSIS — R7303 Prediabetes: Secondary | ICD-10-CM | POA: Diagnosis not present

## 2024-04-30 DIAGNOSIS — F988 Other specified behavioral and emotional disorders with onset usually occurring in childhood and adolescence: Secondary | ICD-10-CM | POA: Diagnosis not present

## 2024-04-30 DIAGNOSIS — K5903 Drug induced constipation: Secondary | ICD-10-CM | POA: Diagnosis not present

## 2024-04-30 DIAGNOSIS — E559 Vitamin D deficiency, unspecified: Secondary | ICD-10-CM | POA: Diagnosis not present

## 2024-05-02 ENCOUNTER — Other Ambulatory Visit (HOSPITAL_BASED_OUTPATIENT_CLINIC_OR_DEPARTMENT_OTHER): Payer: Self-pay

## 2024-05-04 ENCOUNTER — Other Ambulatory Visit (HOSPITAL_BASED_OUTPATIENT_CLINIC_OR_DEPARTMENT_OTHER): Payer: Self-pay

## 2024-05-07 ENCOUNTER — Ambulatory Visit: Admitting: Family Medicine

## 2024-05-14 ENCOUNTER — Other Ambulatory Visit (HOSPITAL_BASED_OUTPATIENT_CLINIC_OR_DEPARTMENT_OTHER): Payer: Self-pay

## 2024-05-15 ENCOUNTER — Other Ambulatory Visit (HOSPITAL_BASED_OUTPATIENT_CLINIC_OR_DEPARTMENT_OTHER): Payer: Self-pay

## 2024-05-15 MED ORDER — METFORMIN HCL ER 500 MG PO TB24
500.0000 mg | ORAL_TABLET | Freq: Every evening | ORAL | 2 refills | Status: AC
  Filled 2024-05-15 – 2024-05-27 (×2): qty 30, 30d supply, fill #0
  Filled 2024-06-22 – 2024-07-05 (×2): qty 30, 30d supply, fill #1
  Filled 2024-08-07: qty 30, 30d supply, fill #2

## 2024-05-25 ENCOUNTER — Other Ambulatory Visit (HOSPITAL_BASED_OUTPATIENT_CLINIC_OR_DEPARTMENT_OTHER): Payer: Self-pay

## 2024-05-27 ENCOUNTER — Other Ambulatory Visit (HOSPITAL_BASED_OUTPATIENT_CLINIC_OR_DEPARTMENT_OTHER): Payer: Self-pay

## 2024-05-27 MED ORDER — LISDEXAMFETAMINE DIMESYLATE 60 MG PO CHEW
60.0000 mg | CHEWABLE_TABLET | Freq: Every day | ORAL | 0 refills | Status: AC
  Filled 2024-06-03: qty 30, 30d supply, fill #0

## 2024-05-27 MED ORDER — LISDEXAMFETAMINE DIMESYLATE 60 MG PO CHEW: 60.0000 mg | CHEWABLE_TABLET | Freq: Every day | ORAL | 0 refills | Status: AC

## 2024-05-29 ENCOUNTER — Other Ambulatory Visit (HOSPITAL_BASED_OUTPATIENT_CLINIC_OR_DEPARTMENT_OTHER): Payer: Self-pay

## 2024-05-30 ENCOUNTER — Other Ambulatory Visit (HOSPITAL_BASED_OUTPATIENT_CLINIC_OR_DEPARTMENT_OTHER): Payer: Self-pay

## 2024-05-31 ENCOUNTER — Other Ambulatory Visit (HOSPITAL_BASED_OUTPATIENT_CLINIC_OR_DEPARTMENT_OTHER): Payer: Self-pay

## 2024-06-03 ENCOUNTER — Other Ambulatory Visit (HOSPITAL_BASED_OUTPATIENT_CLINIC_OR_DEPARTMENT_OTHER): Payer: Self-pay

## 2024-06-06 ENCOUNTER — Encounter: Admitting: Family Medicine

## 2024-06-13 ENCOUNTER — Encounter: Admitting: Family Medicine

## 2024-06-14 ENCOUNTER — Ambulatory Visit: Admitting: Obstetrics and Gynecology

## 2024-06-14 ENCOUNTER — Other Ambulatory Visit (HOSPITAL_BASED_OUTPATIENT_CLINIC_OR_DEPARTMENT_OTHER): Payer: Self-pay

## 2024-06-14 ENCOUNTER — Encounter: Payer: Self-pay | Admitting: Obstetrics and Gynecology

## 2024-06-14 VITALS — BP 117/76 | HR 84 | Wt 124.0 lb

## 2024-06-14 DIAGNOSIS — Z30432 Encounter for removal of intrauterine contraceptive device: Secondary | ICD-10-CM | POA: Diagnosis not present

## 2024-06-14 NOTE — Progress Notes (Signed)
 43 yo P1 presenting for IUD removal. Patient has had the Paraguard IUD for almost 8 years and desires its removal as she does not want a foreign object. She plans to use natural family planning which she has used in the past with great success. She denies pelvic pain or abnormal discharge.   Past Medical History:  Diagnosis Date   ADD (attention deficit disorder)    Alcohol use complicating pregnancy in first trimester 10/28/2015   Formatting of this note might be different from the original. 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    Hypertension    Postpartum care following cesarean delivery (6/22) 04/07/2016   Past Surgical History:  Procedure Laterality Date   BREAST BIOPSY Right 01/23/2023   US  RT BREAST BX W LOC DEV 1ST LESION IMG BX SPEC US  GUIDE 01/23/2023 [facility] MAMMOGRAPHY   CESAREAN SECTION N/A 04/07/2016   Procedure: CESAREAN SECTION;  Surgeon: Robbi Render, MD;  Location: [facility];  Service: Obstetrics;  Laterality: N/A;   MOLE REMOVAL     WISDOM TOOTH EXTRACTION     Family History  Problem Relation Age of Onset   Hyperlipidemia Mother    Other Mother        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   Hypertension Mother    Depression Mother    ADD / ADHD Mother    Alcohol abuse Mother    Hemophilia Cousin        maternal female cousin   Healthy Father    ADD / ADHD Sister    Learning disabilities Sister    Hemophilia Cousin        maternal female cousin   Diabetes Maternal Grandmother    Diabetes Paternal Grandfather    Stroke Sister    Social History   Tobacco Use   Smoking status: Never   Smokeless tobacco: Never  Substance Use Topics   Alcohol use: Yes    Alcohol/week: 5.0 standard drinks of alcohol    Types: 5 Glasses of wine per week    Comment: no use since 10/2019   Drug use: Never   ROS See pertinent  in HPI. All other systems reviewed and non contributory Blood pressure 117/76, pulse 84, weight 124 lb (56.2 kg), last menstrual period 05/15/2024. GENERAL: Well-developed, well-nourished female in no acute distress.  PELVIC: Normal external female genitalia. Vagina is pink and rugated.  Normal discharge. Normal appearing cervix and IUD strings not visualized at the os, despite the use of a cytobrush. Uterus is normal in size. No adnexal mass or tenderness. Chaperone present during the pelvic exam EXTREMITIES: No cyanosis, clubbing, or edema, 2+ distal pulses.  A/P 43 yo here for IUD removal GYNECOLOGY CLINIC PROCEDURE NOTE  Dominique Powers is a 43 y.o. [id number] here for IUD removal. No GYN concerns.  Last pap smear was on 04/2023 and was normal.  IUD Removal  Patient identified, informed consent performed, consent signed.  Patient was in the dorsal lithotomy position, normal external genitalia was noted.  A speculum was placed in the patient's vagina, normal discharge was noted, no lesions. The cervix was visualized, no lesions, no abnormal discharge.  The strings of the IUD were not visualized, so Kelly forceps were introduced into the endometrial cavity and the IUD was grasped and removed in its  entirety).  Patient tolerated the procedure well.    Patient will use natural family planning for contraception.  Routine preventative health maintenance measures emphasized. Patient current on mammogram

## 2024-06-14 NOTE — Progress Notes (Signed)
 Pt is in office for IUD removal.  Pt would like to discuss other options.

## 2024-06-24 ENCOUNTER — Encounter: Payer: Self-pay | Admitting: Family Medicine

## 2024-06-24 NOTE — Telephone Encounter (Signed)
 Can you help with this?

## 2024-07-04 ENCOUNTER — Other Ambulatory Visit (HOSPITAL_BASED_OUTPATIENT_CLINIC_OR_DEPARTMENT_OTHER): Payer: Self-pay

## 2024-07-25 ENCOUNTER — Encounter: Payer: Self-pay | Admitting: Family Medicine

## 2024-07-25 ENCOUNTER — Other Ambulatory Visit: Payer: Self-pay | Admitting: Family Medicine

## 2024-07-25 DIAGNOSIS — F411 Generalized anxiety disorder: Secondary | ICD-10-CM

## 2024-07-25 DIAGNOSIS — F320 Major depressive disorder, single episode, mild: Secondary | ICD-10-CM

## 2024-07-25 MED ORDER — SERTRALINE HCL 100 MG PO TABS: 100.0000 mg | ORAL_TABLET | Freq: Every day | ORAL | 0 refills | Status: DC

## 2024-08-07 ENCOUNTER — Other Ambulatory Visit (HOSPITAL_BASED_OUTPATIENT_CLINIC_OR_DEPARTMENT_OTHER): Payer: Self-pay

## 2024-09-06 ENCOUNTER — Other Ambulatory Visit (HOSPITAL_BASED_OUTPATIENT_CLINIC_OR_DEPARTMENT_OTHER): Payer: Self-pay

## 2024-09-21 ENCOUNTER — Other Ambulatory Visit (HOSPITAL_BASED_OUTPATIENT_CLINIC_OR_DEPARTMENT_OTHER): Payer: Self-pay

## 2024-09-23 ENCOUNTER — Other Ambulatory Visit (HOSPITAL_BASED_OUTPATIENT_CLINIC_OR_DEPARTMENT_OTHER): Payer: Self-pay

## 2024-09-24 ENCOUNTER — Other Ambulatory Visit (HOSPITAL_BASED_OUTPATIENT_CLINIC_OR_DEPARTMENT_OTHER): Payer: Self-pay

## 2024-09-28 ENCOUNTER — Other Ambulatory Visit (HOSPITAL_BASED_OUTPATIENT_CLINIC_OR_DEPARTMENT_OTHER): Payer: Self-pay

## 2024-09-28 ENCOUNTER — Encounter (HOSPITAL_BASED_OUTPATIENT_CLINIC_OR_DEPARTMENT_OTHER): Payer: Self-pay

## 2024-10-24 ENCOUNTER — Encounter: Payer: Self-pay | Admitting: Family Medicine

## 2024-10-24 ENCOUNTER — Other Ambulatory Visit: Payer: Self-pay | Admitting: Family Medicine

## 2024-10-24 DIAGNOSIS — F411 Generalized anxiety disorder: Secondary | ICD-10-CM

## 2024-10-24 DIAGNOSIS — F320 Major depressive disorder, single episode, mild: Secondary | ICD-10-CM

## 2024-10-24 MED ORDER — SERTRALINE HCL 100 MG PO TABS: 100.0000 mg | ORAL_TABLET | Freq: Every day | ORAL | 0 refills | Status: DC

## 2024-10-30 ENCOUNTER — Ambulatory Visit: Payer: Self-pay | Admitting: Family Medicine

## 2024-10-31 ENCOUNTER — Ambulatory Visit: Payer: Self-pay | Admitting: Family Medicine

## 2024-10-31 ENCOUNTER — Encounter: Payer: Self-pay | Admitting: Family Medicine

## 2024-10-31 VITALS — BP 122/84 | HR 99 | Temp 97.6°F | Ht 62.0 in | Wt 142.0 lb

## 2024-10-31 DIAGNOSIS — F419 Anxiety disorder, unspecified: Secondary | ICD-10-CM | POA: Diagnosis not present

## 2024-10-31 DIAGNOSIS — F32A Depression, unspecified: Secondary | ICD-10-CM

## 2024-10-31 DIAGNOSIS — R7303 Prediabetes: Secondary | ICD-10-CM

## 2024-10-31 DIAGNOSIS — E559 Vitamin D deficiency, unspecified: Secondary | ICD-10-CM

## 2024-10-31 DIAGNOSIS — F411 Generalized anxiety disorder: Secondary | ICD-10-CM

## 2024-10-31 DIAGNOSIS — F320 Major depressive disorder, single episode, mild: Secondary | ICD-10-CM | POA: Diagnosis not present

## 2024-10-31 MED ORDER — SERTRALINE HCL 100 MG PO TABS: 100.0000 mg | ORAL_TABLET | Freq: Every day | ORAL | 1 refills | Status: AC

## 2024-10-31 NOTE — Progress Notes (Signed)
 "  Subjective:     Patient ID: Dominique Powers, female    DOB: Mar 30, 1981, 44 y.o.   MRN: [id number]  Chief Complaint  Patient presents with   Medical Management of Chronic Issues    HPI  Discussed the use of AI scribe software for clinical note transcription with the patient, who gave verbal consent to proceed.  History of Present Illness Dominique Powers is a 44 year old female who presents for follow-up on chronic health conditions, specifically anxiety and depression.  Anxiety and depressive symptoms - Anxiety began postpartum; depression is longstanding. - Currently takes Sertraline  for anxiety and depression. - Expresses concern about discontinuing Sertraline . - Recently restarted Sertraline  after a lapse due to lack of insurance, which affected medication adherence.   Medication adherence and changes - Stopped Strattera . - Currently taking Wegovy, Metformin , and Sertraline . - Medication adherence was previously affected by lapse in insurance coverage; recently restarted medications.  Vitamin d  deficiency - Has vitamin D  deficiency; no recent laboratory evaluation.  Occupational stress and lifestyle changes - Transitioned from full-time to substitute teaching, resulting in reduced stress and increased ability to focus on parenting.         10/31/2024   11:16 AM 02/08/2024    1:13 PM 05/09/2023    9:22 AM 03/02/2023    9:48 AM 12/29/2022    2:06 PM  Depression screen PHQ 2/9  Decreased Interest 0 0 0 1 1  Down, Depressed, Hopeless 0 0 0 0 1  PHQ - 2 Score 0 0 0 1 2  Altered sleeping 0  0  2  Tired, decreased energy 0  1  3  Change in appetite 0  0  0  Feeling bad or failure about yourself  0  0  0  Trouble concentrating 0  1  2  Moving slowly or fidgety/restless 0  0  0  Suicidal thoughts 0  0  0  PHQ-9 Score 0  2   9   Difficult doing work/chores     Very difficult     Data saved with a previous flowsheet row definition       Health Maintenance Due   Topic Date Due   Hepatitis C Screening  Never done   Hepatitis B Vaccines 19-59 Average Risk (1 of 3 - 19+ 3-dose series) Never done   DTaP/Tdap/Td (2 - Td or Tdap) 04/03/2024    Past Medical History:  Diagnosis Date   ADD (attention deficit disorder)    Alcohol use complicating pregnancy in first trimester 10/28/2015   Formatting of this note might be different from the original. 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    Hypertension    Postpartum care following cesarean delivery (6/22) 04/07/2016    Past Surgical History:  Procedure Laterality Date   BREAST BIOPSY Right 01/23/2023   US  RT BREAST BX W LOC DEV 1ST LESION IMG BX SPEC US  GUIDE 01/23/2023 [facility]AMMO[facility]APHY   CESAREAN SECTION N/A 04/07/2016   Procedure: CESAREAN SECTION;  Surgeon: Robbi Render, MD;  Location: [facility];  Service: Obstetrics;  Laterality: N/A;   MOLE REMOVAL     WISDOM TOOTH EXTRACTION      Family History  Problem Relation Age of Onset   Hyperlipidemia Mother    Other Mother  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   Hypertension Mother    Depression Mother    ADD / ADHD Mother    Alcohol abuse Mother    Hemophilia Cousin        maternal female cousin   Healthy Father    ADD / ADHD Sister    Learning disabilities Sister    Hemophilia Cousin        maternal female cousin   Diabetes Maternal Grandmother    Diabetes Paternal Grandfather    Stroke Sister     Social History   Socioeconomic History   Marital status: Married    Spouse name: Not on file   Number of children: 1   Years of education: college   Highest education level: Master's degree (e.g., MA, MS, MEng, MEd, MSW, MBA)  Occupational History   Occupation: Teacher  Tobacco Use   Smoking status: Never   Smokeless tobacco: Never  Substance and Sexual Activity   Alcohol  use: Yes    Alcohol/week: 5.0 standard drinks of alcohol    Types: 5 Glasses of wine per week    Comment: no use since 10/2019   Drug use: Never   Sexual activity: Yes    Birth control/protection: I.U.D.  Other Topics Concern   Not on file  Social History Narrative   Lives at home with husband and daughter.   Right-handed.   2 cups caffeine  per day (tea).   Social Drivers of Health   Tobacco Use: Low Risk (10/31/2024)   Patient History    Smoking Tobacco Use: Never    Smokeless Tobacco Use: Never    Passive Exposure: Not on file  Financial Resource Strain: Low Risk (02/08/2024)   Overall Financial Resource Strain (CARDIA)    Difficulty of Paying Living Expenses: Not very hard  Food Insecurity: No Food Insecurity (02/08/2024)   Hunger Vital Sign    Worried About Running Out of Food in the Last Year: Never true    Ran Out of Food in the Last Year: Never true  Transportation Needs: No Transportation Needs (02/08/2024)   PRAPARE - Administrator, Civil Service (Medical): No    Lack of Transportation (Non-Medical): No  Physical Activity: Insufficiently Active (02/08/2024)   Exercise Vital Sign    Days of Exercise per Week: 4 days    Minutes of Exercise per Session: 30 min  Stress: No Stress Concern Present (02/08/2024)   [employer] of Occupational Health - Occupational Stress Questionnaire    Feeling of Stress : Only a little  Social Connections: Unknown (02/08/2024)   Social Connection and Isolation Panel    Frequency of Communication with Friends and Family: Twice a week    Frequency of Social Gatherings with Friends and Family: Not on file    Attends Religious Services: Not on file    Active Member of Clubs or Organizations: Yes    Attends Banker Meetings: 1 to 4 times per year    Marital Status: Married  Catering Manager Violence: Not on file  Depression (PHQ2-9): Low Risk (10/31/2024)   Depression (PHQ2-9)    PHQ-2 Score: 0  Alcohol Screen:  Low Risk (02/08/2024)   Alcohol Screen    Last Alcohol Screening Score (AUDIT): 3  Housing: Low Risk (02/08/2024)   Housing Stability Vital Sign    Unable to Pay for Housing in the Last Year: No    Number of Times Moved in the Last Year: 0    Homeless in the Last Year:  No  Utilities: Not on file  Health Literacy: Not on file    Outpatient Medications Prior to Visit  Medication Sig Dispense Refill   atomoxetine  (STRATTERA ) 80 MG capsule Take 1 capsule (80 mg total) by mouth daily. 30 capsule 5   Cetirizine HCl (ZYRTEC ALLERGY) 10 MG TBDP      Lisdexamfetamine  Dimesylate (VYVANSE ) 60 MG CHEW Chew by mouth.     Lisdexamfetamine  Dimesylate 60 MG CHEW Chew 1 tablet (60 mg total) by mouth daily. 30 tablet 0   Lisdexamfetamine  Dimesylate 60 MG CHEW Chew 1 tablet (60 mg total) by mouth daily. (60 days) 30 tablet 0   Lisdexamfetamine  Dimesylate 60 MG CHEW Chew 1 tablet (60 mg total) by mouth daily. 30 tablet 0   Lisdexamfetamine  Dimesylate 60 MG CHEW Chew 1 tablet (60 mg total) by mouth daily. 30 tablet 0   Lisdexamfetamine  Dimesylate 60 MG CHEW Chew 1 tablet (60 mg total) by mouth daily. 30 tablet 0   metFORMIN  (GLUCOPHAGE -XR) 500 MG 24 hr tablet Take 1 tablet (500 mg total) by mouth every evening with meal. 30 tablet 2   tirzepatide (ZEPBOUND) 5 MG/0.5ML Pen Inject 5 mg into the skin once a week.     VITAMIN D  PO Take by mouth.     sertraline  (ZOLOFT ) 100 MG tablet Take 1 tablet (100 mg total) by mouth daily. 30 tablet 0   No facility-administered medications prior to visit.    Allergies[1]  Review of Systems  Constitutional:  Negative for chills and fever.  Respiratory:  Negative for shortness of breath.   Cardiovascular:  Negative for chest pain, palpitations and leg swelling.  Gastrointestinal:  Negative for abdominal pain, constipation, diarrhea, nausea and vomiting.  Genitourinary:  Negative for dysuria, frequency and urgency.  Neurological:  Negative for dizziness.        Objective:    Physical Exam Constitutional:      General: She is not in acute distress.    Appearance: She is not ill-appearing.  Eyes:     Extraocular Movements: Extraocular movements intact.     Conjunctiva/sclera: Conjunctivae normal.  Cardiovascular:     Rate and Rhythm: Normal rate.  Pulmonary:     Effort: Pulmonary effort is normal.  Musculoskeletal:     Cervical back: Normal range of motion and neck supple.  Skin:    General: Skin is warm and dry.  Neurological:     General: No focal deficit present.     Mental Status: She is alert and oriented to person, place, and time.  Psychiatric:        Mood and Affect: Mood normal.        Behavior: Behavior normal.        Thought Content: Thought content normal.      BP 122/84   Pulse 99   Temp 97.6 F (36.4 C) (Temporal)   Ht 5' 2 (1.575 m)   Wt 142 lb (64.4 kg)   SpO2 100%   BMI 25.97 kg/m  Wt Readings from Last 3 Encounters:  10/31/24 142 lb (64.4 kg)  06/14/24 124 lb (56.2 kg)  02/08/24 139 lb (63 kg)       Assessment & Plan:   Problem List Items Addressed This Visit     Vitamin D  deficiency   Other Visit Diagnoses       Anxiety and depression    -  Primary   Relevant Medications   sertraline  (ZOLOFT ) 100 MG tablet     Prediabetes  GAD (generalized anxiety disorder)       Relevant Medications   sertraline  (ZOLOFT ) 100 MG tablet     Depression, major, single episode, mild       Relevant Medications   sertraline  (ZOLOFT ) 100 MG tablet       Assessment and Plan Assessment & Plan Major depressive disorder and generalized anxiety disorder Currently managed with sertraline . Reports effectiveness but expresses concern about long-term use. Anxiety developed postpartum, with depression being more prominent.  - Prescribed sertraline  with a 90-day supply and refills for six months. - Scheduled follow-up in three months for a fasting physical and to reassess mental health status.  Vitamin D   deficiency No recent labs available to assess current status. Previous communication about sharing lab results was not completed. - Requested recent lab results from other providers if available.  General Health Maintenance No recent labs since 2024. Last A1c was 5.9 in 2024. No current need for labs as per her report. - Scheduled a fasting physical in three months to reassess overall health status.  Pre-diabetes Reports improvement in A1c levels. No recent labs available to assess current status. - Continue current management with metformin  and tirzepatide. - Will reassess A1c during the next fasting physical.     I am having Mahitha Aloia maintain her ZyrTEC Allergy, Lisdexamfetamine  Dimesylate, VITAMIN D  PO, Zepbound, Lisdexamfetamine  Dimesylate, Lisdexamfetamine  Dimesylate, atomoxetine , metFORMIN , Lisdexamfetamine  Dimesylate, Lisdexamfetamine  Dimesylate, Lisdexamfetamine  Dimesylate, and sertraline .  Meds ordered this encounter  Medications   sertraline  (ZOLOFT ) 100 MG tablet    Sig: Take 1 tablet (100 mg total) by mouth daily.    Dispense:  90 tablet    Refill:  1    Supervising Provider:   ROLLENE NORRIS A [[id number]]       [1]  Allergies Allergen Reactions   Amoxicillin Anaphylaxis    Has patient had a PCN reaction causing immediate rash, facial/tongue/throat swelling, SOB or lightheadedness with hypotension: Yes Has patient had a PCN reaction causing severe rash involving mucus membranes or skin necrosis: No Has patient had a PCN reaction that required hospitalization Yes Has patient had a PCN reaction occurring within the last 10 years: No If all of the above answers are NO, then may proceed with Cephalosporin use.    "

## 2025-01-29 ENCOUNTER — Encounter: Admitting: Family Medicine
